# Patient Record
Sex: Female | Born: 1995
Health system: Southern US, Community
[De-identification: ages and names within clinical notes are randomized; demographics above are authoritative.]

## PROBLEM LIST (undated history)

## (undated) ENCOUNTER — Inpatient Hospital Stay (HOSPITAL_COMMUNITY): Payer: Self-pay

## (undated) DIAGNOSIS — R011 Cardiac murmur, unspecified: Secondary | ICD-10-CM

## (undated) DIAGNOSIS — R519 Headache, unspecified: Secondary | ICD-10-CM

## (undated) DIAGNOSIS — F1210 Cannabis abuse, uncomplicated: Secondary | ICD-10-CM

## (undated) DIAGNOSIS — R51 Headache: Secondary | ICD-10-CM

---
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
## 2013-11-06 ENCOUNTER — Encounter (HOSPITAL_BASED_OUTPATIENT_CLINIC_OR_DEPARTMENT_OTHER): Payer: Self-pay | Admitting: Emergency Medicine

## 2013-11-06 ENCOUNTER — Emergency Department (HOSPITAL_BASED_OUTPATIENT_CLINIC_OR_DEPARTMENT_OTHER)
Admission: EM | Admit: 2013-11-06 | Discharge: 2013-11-07 | Disposition: A | Discharge status: Home / Self Care | Payer: Medicaid Other | Attending: Emergency Medicine | Admitting: Emergency Medicine

## 2013-11-06 DIAGNOSIS — Z3202 Encounter for pregnancy test, result negative: Secondary | ICD-10-CM | POA: Insufficient documentation

## 2013-11-06 DIAGNOSIS — Z202 Contact with and (suspected) exposure to infections with a predominantly sexual mode of transmission: Secondary | ICD-10-CM | POA: Insufficient documentation

## 2013-11-06 DIAGNOSIS — N72 Inflammatory disease of cervix uteri: Secondary | ICD-10-CM

## 2013-11-06 DIAGNOSIS — N390 Urinary tract infection, site not specified: Secondary | ICD-10-CM | POA: Insufficient documentation

## 2013-11-06 LAB — URINALYSIS, ROUTINE W REFLEX MICROSCOPIC
Bilirubin Urine: NEGATIVE
Glucose, UA: NEGATIVE mg/dL
Hgb urine dipstick: NEGATIVE
KETONES UR: 15 mg/dL — AB
NITRITE: NEGATIVE
PH: 7 (ref 5.0–8.0)
PROTEIN: NEGATIVE mg/dL
Specific Gravity, Urine: 1.023 (ref 1.005–1.030)
UROBILINOGEN UA: 1 mg/dL (ref 0.0–1.0)

## 2013-11-06 LAB — URINE MICROSCOPIC-ADD ON

## 2013-11-06 LAB — WET PREP, GENITAL
TRICH WET PREP: NONE SEEN
Yeast Wet Prep HPF POC: NONE SEEN

## 2013-11-06 LAB — PREGNANCY, URINE: Preg Test, Ur: NEGATIVE

## 2013-11-06 NOTE — ED Provider Notes (Signed)
CSN: [id number]     Arrival date & time 11/06/13  2146 History  This chart was scribed for Jacaden Forbush Smitty CordsK Laquanna Veazey-Rasch, MD by Ellin MayhewMichael Levi, ED Scribe. This patient was seen in room MH09/MH09 and the patient's care was started at 11:11 PM.   Chief Complaint  Patient presents with  . Vaginal Discharge  . Exposure to STD   Patient is a 18 y.o. female presenting with STD exposure. The history is provided by the patient.  Exposure to STD This is a chronic problem. The current episode started more than 1 week ago. The problem occurs constantly. The problem has not changed since onset.Pertinent negatives include no chest pain, no abdominal pain, no headaches and no shortness of breath. Nothing aggravates the symptoms. Nothing relieves the symptoms. She has tried nothing for the symptoms. The treatment provided no relief.    HPI Comments: Drema DallasZenobia A Stroot is a 18 y.o. female who presents to the Emergency Department with a chief complaint of vaginal discharge with onset 3-5 days. Patient describes the discharge as a white, translucent liquid. LNMP 3 years ago, and has received Depo-Provera. Patient denies any vaginal pain, dysuria or frequency. Patient reports having changed sexual partners 6 months ago, and is not aware of STD status of partner.  History reviewed. No pertinent past medical history. History reviewed. No pertinent past surgical history. History reviewed. No pertinent family history. History  Substance Use Topics  . Smoking status: Never Smoker   . Smokeless tobacco: Not on file  . Alcohol Use: No   OB History   Grav Para Term Preterm Abortions TAB SAB Ect Mult Living                 Review of Systems  Constitutional: Negative for fever and chills.  Respiratory: Negative for shortness of breath.   Cardiovascular: Negative for chest pain.  Gastrointestinal: Negative for nausea, vomiting and abdominal pain.  Genitourinary: Positive for vaginal discharge. Negative for dysuria,  urgency, frequency, vaginal bleeding and vaginal pain.  Neurological: Negative for weakness and headaches.  All other systems reviewed and are negative.   Allergies  Review of patient's allergies indicates no known allergies.  Home Medications  No current outpatient prescriptions on file.  Triage Vitals: BP 121/66  Pulse 74  Temp(Src) 98.7 F (37.1 C) (Oral)  Resp 18  Ht 5\' 1"  (1.549 m)  Wt 120 lb (54.432 kg)  BMI 22.69 kg/m2  SpO2 98%  Physical Exam  Nursing note and vitals reviewed. Constitutional: She is oriented to person, place, and time. She appears well-developed and well-nourished. No distress.  HENT:  Head: Normocephalic and atraumatic.  Eyes: Conjunctivae and EOM are normal. Pupils are equal, round, and reactive to light.  Neck: Normal range of motion. Neck supple. No tracheal deviation present.  Cardiovascular: Normal rate.   Pulmonary/Chest: Effort normal. No respiratory distress.  Abdominal: Soft. Bowel sounds are normal. There is no tenderness. There is no rebound.  Genitourinary: Vaginal discharge found.  Green discharge chaperone present   Musculoskeletal: Normal range of motion.  Neurological: She is alert and oriented to person, place, and time.  Skin: Skin is warm and dry.  Psychiatric: She has a normal mood and affect. Her behavior is normal.    ED Course  Procedures (including critical care time)  DIAGNOSTIC STUDIES: Oxygen Saturation is 98% on room air, nml by my interpretation.    COORDINATION OF CARE: 11:20 PM-Wet prep genital, CG/Chlamydia Probe Amp, Pelvic cart, UA, and pregnancy labs ordered. Will  F/U with patient following results.Treatment plan discussed with patient and patient agrees.  Labs Review Labs Reviewed  URINALYSIS, ROUTINE W REFLEX MICROSCOPIC - Abnormal; Notable for the following:    APPearance CLOUDY (*)    Ketones, ur 15 (*)    Leukocytes, UA LARGE (*)    All other components within normal limits  URINE MICROSCOPIC-ADD  ON - Abnormal; Notable for the following:    Squamous Epithelial / LPF MANY (*)    Bacteria, UA MANY (*)    All other components within normal limits  PREGNANCY, URINE   Imaging Review No results found.   EKG Interpretation None      MDM   Final diagnoses:  None    Will treat for STIs, [facility]  I personally performed the services described in this documentation, which was scribed in my presence. The recorded information has been reviewed and is accurate.    Jasmine Awe, MD 11/07/13 [phone]

## 2013-11-06 NOTE — ED Notes (Signed)
Pt requesting STD checks and pregnancy test.

## 2013-11-07 MED ORDER — NITROFURANTOIN MONOHYD MACRO 100 MG PO CAPS
100.0000 mg | ORAL_CAPSULE | Freq: Once | ORAL | Status: AC
  Administered 2013-11-07: 100 mg via ORAL
  Filled 2013-11-07: qty 1

## 2013-11-07 MED ORDER — LIDOCAINE HCL (PF) 1 % IJ SOLN
INTRAMUSCULAR | Status: AC
  Administered 2013-11-07: 1.2 mL
  Filled 2013-11-07: qty 5

## 2013-11-07 MED ORDER — NITROFURANTOIN MONOHYD MACRO 100 MG PO CAPS: 100.0000 mg | ORAL_CAPSULE | Freq: Two times a day (BID) | ORAL | Status: DC

## 2013-11-07 MED ORDER — AZITHROMYCIN 1 G PO PACK
1.0000 g | PACK | Freq: Once | ORAL | Status: AC
  Administered 2013-11-07: 1 g via ORAL
  Filled 2013-11-07: qty 1

## 2013-11-07 MED ORDER — CEFTRIAXONE SODIUM 250 MG IJ SOLR
250.0000 mg | Freq: Once | INTRAMUSCULAR | Status: AC
  Administered 2013-11-07: 250 mg via INTRAMUSCULAR
  Filled 2013-11-07: qty 250

## 2013-11-07 NOTE — ED Notes (Signed)
MD at bedside discussing test results and dispo plan of care. 

## 2013-11-08 LAB — GC/CHLAMYDIA PROBE AMP
CT Probe RNA: NEGATIVE
GC Probe RNA: NEGATIVE

## 2014-03-12 ENCOUNTER — Emergency Department (HOSPITAL_BASED_OUTPATIENT_CLINIC_OR_DEPARTMENT_OTHER)
Admission: EM | Admit: 2014-03-12 | Discharge: 2014-03-12 | Disposition: A | Discharge status: Home / Self Care | Payer: Medicaid Other | Attending: Emergency Medicine | Admitting: Emergency Medicine

## 2014-03-12 ENCOUNTER — Encounter (HOSPITAL_BASED_OUTPATIENT_CLINIC_OR_DEPARTMENT_OTHER): Payer: Self-pay | Admitting: Emergency Medicine

## 2014-03-12 DIAGNOSIS — N390 Urinary tract infection, site not specified: Secondary | ICD-10-CM

## 2014-03-12 DIAGNOSIS — B9689 Other specified bacterial agents as the cause of diseases classified elsewhere: Secondary | ICD-10-CM | POA: Insufficient documentation

## 2014-03-12 DIAGNOSIS — R110 Nausea: Secondary | ICD-10-CM | POA: Insufficient documentation

## 2014-03-12 DIAGNOSIS — A499 Bacterial infection, unspecified: Secondary | ICD-10-CM | POA: Insufficient documentation

## 2014-03-12 DIAGNOSIS — N760 Acute vaginitis: Secondary | ICD-10-CM | POA: Insufficient documentation

## 2014-03-12 DIAGNOSIS — Z3202 Encounter for pregnancy test, result negative: Secondary | ICD-10-CM | POA: Insufficient documentation

## 2014-03-12 HISTORY — DX: Headache: R51

## 2014-03-12 HISTORY — DX: Headache, unspecified: R51.9

## 2014-03-12 LAB — CBC WITH DIFFERENTIAL/PLATELET
BASOS ABS: 0 10*3/uL (ref 0.0–0.1)
Basophils Relative: 0 % (ref 0–1)
EOS ABS: 0.2 10*3/uL (ref 0.0–0.7)
EOS PCT: 3 % (ref 0–5)
HEMATOCRIT: 35.9 % — AB (ref 36.0–46.0)
Hemoglobin: 11.7 g/dL — ABNORMAL LOW (ref 12.0–15.0)
LYMPHS ABS: 2.5 10*3/uL (ref 0.7–4.0)
Lymphocytes Relative: 31 % (ref 12–46)
MCH: 32.4 pg (ref 26.0–34.0)
MCHC: 32.6 g/dL (ref 30.0–36.0)
MCV: 99.4 fL (ref 78.0–100.0)
MONO ABS: 0.8 10*3/uL (ref 0.1–1.0)
Monocytes Relative: 9 % (ref 3–12)
Neutro Abs: 4.7 10*3/uL (ref 1.7–7.7)
Neutrophils Relative %: 57 % (ref 43–77)
PLATELETS: 143 10*3/uL — AB (ref 150–400)
RBC: 3.61 MIL/uL — ABNORMAL LOW (ref 3.87–5.11)
RDW: 12.5 % (ref 11.5–15.5)
WBC: 8.1 10*3/uL (ref 4.0–10.5)

## 2014-03-12 LAB — WET PREP, GENITAL
Trich, Wet Prep: NONE SEEN
YEAST WET PREP: NONE SEEN

## 2014-03-12 LAB — URINALYSIS, ROUTINE W REFLEX MICROSCOPIC
BILIRUBIN URINE: NEGATIVE
GLUCOSE, UA: NEGATIVE mg/dL
HGB URINE DIPSTICK: NEGATIVE
KETONES UR: NEGATIVE mg/dL
Nitrite: NEGATIVE
PROTEIN: NEGATIVE mg/dL
Specific Gravity, Urine: 1.013 (ref 1.005–1.030)
UROBILINOGEN UA: 1 mg/dL (ref 0.0–1.0)
pH: 7 (ref 5.0–8.0)

## 2014-03-12 LAB — URINE MICROSCOPIC-ADD ON

## 2014-03-12 LAB — BASIC METABOLIC PANEL
Anion gap: 13 (ref 5–15)
BUN: 8 mg/dL (ref 6–23)
CALCIUM: 9.4 mg/dL (ref 8.4–10.5)
CO2: 25 meq/L (ref 19–32)
CREATININE: 0.8 mg/dL (ref 0.50–1.10)
Chloride: 104 mEq/L (ref 96–112)
GFR calc Af Amer: >90 mL/min (ref >90)
GLUCOSE: 72 mg/dL (ref 70–99)
Potassium: 3.9 mEq/L (ref 3.7–5.3)
SODIUM: 142 meq/L (ref 137–147)

## 2014-03-12 LAB — PREGNANCY, URINE: Preg Test, Ur: NEGATIVE

## 2014-03-12 MED ORDER — METRONIDAZOLE 500 MG PO TABS: 500.0000 mg | ORAL_TABLET | Freq: Two times a day (BID) | ORAL | Status: DC

## 2014-03-12 MED ORDER — ONDANSETRON HCL 4 MG/2ML IJ SOLN
4.0000 mg | Freq: Once | INTRAMUSCULAR | Status: AC
  Administered 2014-03-12: 4 mg via INTRAVENOUS
  Filled 2014-03-12: qty 2

## 2014-03-12 MED ORDER — CEPHALEXIN 500 MG PO CAPS: 500.0000 mg | ORAL_CAPSULE | Freq: Four times a day (QID) | ORAL | Status: DC

## 2014-03-12 MED ORDER — SODIUM CHLORIDE 0.9 % IV BOLUS (SEPSIS)
1000.0000 mL | Freq: Once | INTRAVENOUS | Status: AC
  Administered 2014-03-12: 1000 mL via INTRAVENOUS

## 2014-03-12 MED ORDER — MORPHINE SULFATE 4 MG/ML IJ SOLN
4.0000 mg | Freq: Once | INTRAMUSCULAR | Status: AC
  Administered 2014-03-12: 4 mg via INTRAVENOUS
  Filled 2014-03-12: qty 1

## 2014-03-12 NOTE — ED Notes (Signed)
Pelvic cart set up in room 

## 2014-03-12 NOTE — ED Provider Notes (Signed)
CSN: [id number]     Arrival date & time 03/12/14  1412 History   First MD Initiated Contact with Patient 03/12/14 1422     Chief Complaint  Patient presents with  . Abdominal Pain     (Consider location/radiation/quality/duration/timing/severity/associated sxs/prior Treatment) HPI Comments: Patient is a 18 year old female who presents with abdominal pain for the past 2 days. The pain is located in her lower abdomen and radiates to her back. The pain is described as cramping and severe. The pain started gradually and progressively worsened since the onset. No alleviating/aggravating factors. The patient has tried nothing for symptoms without relief. Associated symptoms include mild nausea and vaginal discharge. Patient denies fever, headache, vomiting, diarrhea, chest pain, SOB, dysuria, constipation, abnormal vaginal bleeding.     Past Medical History  Diagnosis Date  . Headache    No past surgical history on file. No family history on file. History  Substance Use Topics  . Smoking status: Never Smoker   . Smokeless tobacco: Not on file  . Alcohol Use: No   OB History   Grav Para Term Preterm Abortions TAB SAB Ect Mult Living                 Review of Systems  Constitutional: Negative for fever, chills and fatigue.  HENT: Negative for trouble swallowing.   Eyes: Negative for visual disturbance.  Respiratory: Negative for shortness of breath.   Cardiovascular: Negative for chest pain and palpitations.  Gastrointestinal: Positive for nausea and abdominal pain. Negative for vomiting and diarrhea.  Genitourinary: Negative for dysuria and difficulty urinating.  Musculoskeletal: Positive for back pain. Negative for arthralgias and neck pain.  Skin: Negative for color change.  Neurological: Negative for dizziness and weakness.  Psychiatric/Behavioral: Negative for dysphoric mood.      Allergies  Review of patient's allergies indicates no known allergies.  Home Medications    Prior to Admission medications   Not on File   BP 116/68  Pulse 73  Temp(Src) 97.9 F (36.6 C) (Oral)  Resp 16  Ht 5\' 1"  (1.549 m)  Wt 116 lb (52.617 kg)  BMI 21.93 kg/m2  SpO2 100% Physical Exam  Nursing note and vitals reviewed. Constitutional: She is oriented to person, place, and time. She appears well-developed and well-nourished. No distress.  HENT:  Head: Normocephalic and atraumatic.  Eyes: Conjunctivae and EOM are normal.  Neck: Normal range of motion.  Cardiovascular: Normal rate and regular rhythm.  Exam reveals no gallop and no friction rub.   No murmur heard. Pulmonary/Chest: Effort normal and breath sounds normal. She has no wheezes. She has no rales. She exhibits no tenderness.  Abdominal: Soft. She exhibits no distension. There is tenderness. There is no rebound and no guarding.  Suprapubic tenderness to palpation. No other focal tenderness or peritoneal signs.   Genitourinary:  No CVA tenderness.   Musculoskeletal: Normal range of motion.  Neurological: She is alert and oriented to person, place, and time. Coordination normal.  Speech is goal-oriented. Moves limbs without ataxia.   Skin: Skin is warm and dry.  Psychiatric: She has a normal mood and affect. Her behavior is normal.    ED Course  Procedures (including critical care time) Labs Review Labs Reviewed  WET PREP, GENITAL - Abnormal; Notable for the following:    Clue Cells Wet Prep HPF POC MANY (*)    WBC, Wet Prep HPF POC MANY (*)    All other components within normal limits  URINALYSIS, ROUTINE W  REFLEX MICROSCOPIC - Abnormal; Notable for the following:    APPearance CLOUDY (*)    Leukocytes, UA LARGE (*)    All other components within normal limits  CBC WITH DIFFERENTIAL - Abnormal; Notable for the following:    RBC 3.61 (*)    Hemoglobin 11.7 (*)    HCT 35.9 (*)    Platelets 143 (*)    All other components within normal limits  URINE MICROSCOPIC-ADD ON - Abnormal; Notable for the  following:    Squamous Epithelial / LPF FEW (*)    Bacteria, UA MANY (*)    All other components within normal limits  GC/CHLAMYDIA PROBE AMP  PREGNANCY, URINE  BASIC METABOLIC PANEL    Imaging Review No results found.   EKG Interpretation None      MDM   Final diagnoses:  UTI (lower urinary tract infection)  BV (bacterial vaginosis)    3:33 PM Labs pending. Patient has a UTI. Patient will have pelvic exam. Vitals stable and patient afebrile. Patient given morphine and zofran for symptoms.   4:32 PM Patient has BV based on clue cells on wet prep. Patient will have Flagyl for BV and keflex for UTI. Vitals stable and patient afebrile. Patient instructed to follow up with PCP.   Emilia BeckKaitlyn Waddell Iten, [place] JerseyPA-C 03/12/14 [id number]

## 2014-03-12 NOTE — ED Provider Notes (Signed)
Medical screening examination/treatment/procedure(s) were performed by non-physician practitioner and as supervising physician I was immediately available for consultation/collaboration.   EKG Interpretation None        William Dominigue Gellner, MD 03/12/14 1756 

## 2014-03-12 NOTE — ED Notes (Signed)
Pt having lower abdominal pain since two days ago radiating to back.  Pt also c/o headache starting today. Some nausea.  No V/D.  No known fever.

## 2014-03-12 NOTE — Discharge Instructions (Signed)
Take Flagyl as directed until gone for your BV. Take keflex as directed until gone for your UTI. Refer to attached documents for more information.

## 2014-03-13 LAB — GC/CHLAMYDIA PROBE AMP
CT Probe RNA: NEGATIVE
GC Probe RNA: NEGATIVE

## 2015-08-03 ENCOUNTER — Encounter (HOSPITAL_BASED_OUTPATIENT_CLINIC_OR_DEPARTMENT_OTHER): Payer: Self-pay | Admitting: Emergency Medicine

## 2015-08-03 ENCOUNTER — Emergency Department (HOSPITAL_BASED_OUTPATIENT_CLINIC_OR_DEPARTMENT_OTHER)
Admission: EM | Admit: 2015-08-03 | Discharge: 2015-08-03 | Disposition: A | Discharge status: Home / Self Care | Payer: Medicaid Other | Attending: Emergency Medicine | Admitting: Emergency Medicine

## 2015-08-03 DIAGNOSIS — Z792 Long term (current) use of antibiotics: Secondary | ICD-10-CM | POA: Insufficient documentation

## 2015-08-03 DIAGNOSIS — R1110 Vomiting, unspecified: Secondary | ICD-10-CM | POA: Insufficient documentation

## 2015-08-03 DIAGNOSIS — N390 Urinary tract infection, site not specified: Secondary | ICD-10-CM | POA: Insufficient documentation

## 2015-08-03 DIAGNOSIS — R197 Diarrhea, unspecified: Secondary | ICD-10-CM | POA: Insufficient documentation

## 2015-08-03 DIAGNOSIS — Z3202 Encounter for pregnancy test, result negative: Secondary | ICD-10-CM | POA: Insufficient documentation

## 2015-08-03 LAB — URINALYSIS, ROUTINE W REFLEX MICROSCOPIC
BILIRUBIN URINE: NEGATIVE
GLUCOSE, UA: NEGATIVE mg/dL
HGB URINE DIPSTICK: NEGATIVE
KETONES UR: 40 mg/dL — AB
NITRITE: NEGATIVE
PH: 8.5 — AB (ref 5.0–8.0)
Protein, ur: 100 mg/dL — AB
SPECIFIC GRAVITY, URINE: 1.025 (ref 1.005–1.030)

## 2015-08-03 LAB — COMPREHENSIVE METABOLIC PANEL
ALBUMIN: 4.6 g/dL (ref 3.5–5.0)
ALK PHOS: 73 U/L (ref 38–126)
ALT: 20 U/L (ref 14–54)
AST: 28 U/L (ref 15–41)
Anion gap: 10 (ref 5–15)
BILIRUBIN TOTAL: 0.5 mg/dL (ref 0.3–1.2)
BUN: 11 mg/dL (ref 6–20)
CALCIUM: 9.5 mg/dL (ref 8.9–10.3)
CO2: 21 mmol/L — AB (ref 22–32)
Chloride: 108 mmol/L (ref 101–111)
Creatinine, Ser: 0.64 mg/dL (ref 0.44–1.00)
GFR calc Af Amer: >60 mL/min (ref >60)
GFR calc non Af Amer: >60 mL/min (ref >60)
GLUCOSE: 130 mg/dL — AB (ref 65–99)
POTASSIUM: 3.9 mmol/L (ref 3.5–5.1)
SODIUM: 139 mmol/L (ref 135–145)
TOTAL PROTEIN: 7.6 g/dL (ref 6.5–8.1)

## 2015-08-03 LAB — URINE MICROSCOPIC-ADD ON

## 2015-08-03 LAB — CBC
HEMATOCRIT: 38.4 % (ref 36.0–46.0)
Hemoglobin: 12.4 g/dL (ref 12.0–15.0)
MCH: 31.5 pg (ref 26.0–34.0)
MCHC: 32.3 g/dL (ref 30.0–36.0)
MCV: 97.5 fL (ref 78.0–100.0)
Platelets: 201 10*3/uL (ref 150–400)
RBC: 3.94 MIL/uL (ref 3.87–5.11)
RDW: 12.3 % (ref 11.5–15.5)
WBC: 9.1 10*3/uL (ref 4.0–10.5)

## 2015-08-03 LAB — PREGNANCY, URINE: Preg Test, Ur: NEGATIVE

## 2015-08-03 LAB — LIPASE, BLOOD: Lipase: 19 U/L (ref 11–51)

## 2015-08-03 MED ORDER — ONDANSETRON 4 MG PO TBDP: 4.0000 mg | ORAL_TABLET | Freq: Three times a day (TID) | ORAL | Status: DC | PRN

## 2015-08-03 MED ORDER — ONDANSETRON HCL 4 MG/2ML IJ SOLN
4.0000 mg | Freq: Once | INTRAMUSCULAR | Status: AC
  Administered 2015-08-03: 4 mg via INTRAVENOUS
  Filled 2015-08-03: qty 2

## 2015-08-03 MED ORDER — SODIUM CHLORIDE 0.9 % IV BOLUS (SEPSIS)
1000.0000 mL | Freq: Once | INTRAVENOUS | Status: AC
  Administered 2015-08-03: 1000 mL via INTRAVENOUS

## 2015-08-03 MED ORDER — CEPHALEXIN 500 MG PO CAPS: 500.0000 mg | ORAL_CAPSULE | Freq: Two times a day (BID) | ORAL | Status: DC

## 2015-08-03 NOTE — ED Notes (Signed)
Patient reports that she is N/V  - denies any pain

## 2015-08-03 NOTE — Discharge Instructions (Signed)
Nausea and Vomiting °Nausea is a sick feeling that often comes before throwing up (vomiting). Vomiting is a reflex where stomach contents come out of your mouth. Vomiting can cause severe loss of body fluids (dehydration). Children and elderly adults can become dehydrated quickly, especially if they also have diarrhea. Nausea and vomiting are symptoms of a condition or disease. It is important to find the cause of your symptoms. °CAUSES  °· Direct irritation of the stomach lining. This irritation can result from increased acid production (gastroesophageal reflux disease), infection, food poisoning, taking certain medicines (such as nonsteroidal anti-inflammatory drugs), alcohol use, or tobacco use. °· Signals from the brain. These signals could be caused by a headache, heat exposure, an inner ear disturbance, increased pressure in the brain from injury, infection, a tumor, or a concussion, pain, emotional stimulus, or metabolic problems. °· An obstruction in the gastrointestinal tract (bowel obstruction). °· Illnesses such as diabetes, hepatitis, gallbladder problems, appendicitis, kidney problems, cancer, sepsis, atypical symptoms of a heart attack, or eating disorders. °· Medical treatments such as chemotherapy and radiation. °· Receiving medicine that makes you sleep (general anesthetic) during surgery. °DIAGNOSIS °Your caregiver may ask for tests to be done if the problems do not improve after a few days. Tests may also be done if symptoms are severe or if the reason for the nausea and vomiting is not clear. Tests may include: °· Urine tests. °· Blood tests. °· Stool tests. °· Cultures (to look for evidence of infection). °· X-rays or other imaging studies. °Test results can help your caregiver make decisions about treatment or the need for additional tests. °TREATMENT °You need to stay well hydrated. Drink frequently but in small amounts. You may wish to drink water, sports drinks, clear broth, or eat frozen  ice pops or gelatin dessert to help stay hydrated. When you eat, eating slowly may help prevent nausea. There are also some antinausea medicines that may help prevent nausea. °HOME CARE INSTRUCTIONS  °· Take all medicine as directed by your caregiver. °· If you do not have an appetite, do not force yourself to eat. However, you must continue to drink fluids. °· If you have an appetite, eat a normal diet unless your caregiver tells you differently. °· Eat a variety of complex carbohydrates (rice, wheat, potatoes, bread), lean meats, yogurt, fruits, and vegetables. °· Avoid high-fat foods because they are more difficult to digest. °· Drink enough water and fluids to keep your urine clear or pale yellow. °· If you are dehydrated, ask your caregiver for specific rehydration instructions. Signs of dehydration may include: °· Severe thirst. °· Dry lips and mouth. °· Dizziness. °· Dark urine. °· Decreasing urine frequency and amount. °· Confusion. °· Rapid breathing or pulse. °SEEK IMMEDIATE MEDICAL CARE IF:  °· You have blood or brown flecks (like coffee grounds) in your vomit. °· You have black or bloody stools. °· You have a severe headache or stiff neck. °· You are confused. °· You have severe abdominal pain. °· You have chest pain or trouble breathing. °· You do not urinate at least once every 8 hours. °· You develop cold or clammy skin. °· You continue to vomit for longer than 24 to 48 hours. °· You have a fever. °MAKE SURE YOU:  °· Understand these instructions. °· Will watch your condition. °· Will get help right away if you are not doing well or get worse. °  °This information is not intended to replace advice given to you by your health care provider. Make sure   you discuss any questions you have with your health care provider. °  °Document Released: 08/08/2005 Document Revised: 10/31/2011 Document Reviewed: 01/05/2011 °Elsevier Interactive Patient Education ©2016 [employer] ° °Urinary Tract Infection °Urinary  tract infections (UTIs) can develop anywhere along your urinary tract. Your urinary tract is your body's drainage system for removing wastes and extra water. Your urinary tract includes two kidneys, two ureters, a bladder, and a urethra. Your kidneys are a pair of bean-shaped organs. Each kidney is about the size of your fist. They are located below your ribs, one on each side of your spine. °CAUSES °Infections are caused by microbes, which are microscopic organisms, including fungi, viruses, and bacteria. These organisms are so small that they can only be seen through a microscope. Bacteria are the microbes that most commonly cause UTIs. °SYMPTOMS  °Symptoms of UTIs may vary by age and gender of the patient and by the location of the infection. Symptoms in young women typically include a frequent and intense urge to urinate and a painful, burning feeling in the bladder or urethra during urination. Older women and men are more likely to be tired, shaky, and weak and have muscle aches and abdominal pain. A fever may mean the infection is in your kidneys. Other symptoms of a kidney infection include pain in your back or sides below the ribs, nausea, and vomiting. °DIAGNOSIS °To diagnose a UTI, your caregiver will ask you about your symptoms. Your caregiver will also ask you to provide a urine sample. The urine sample will be tested for bacteria and white blood cells. White blood cells are made by your body to help fight infection. °TREATMENT  °Typically, UTIs can be treated with medication. Because most UTIs are caused by a bacterial infection, they usually can be treated with the use of antibiotics. The choice of antibiotic and length of treatment depend on your symptoms and the type of bacteria causing your infection. °HOME CARE INSTRUCTIONS °· If you were prescribed antibiotics, take them exactly as your caregiver instructs you. Finish the medication even if you feel better after you have only taken some of the  medication. °· Drink enough water and fluids to keep your urine clear or pale yellow. °· Avoid caffeine, tea, and carbonated beverages. They tend to irritate your bladder. °· Empty your bladder often. Avoid holding urine for long periods of time. °· Empty your bladder before and after sexual intercourse. °· After a bowel movement, women should cleanse from front to back. Use each tissue only once. °SEEK MEDICAL CARE IF:  °· You have back pain. °· You develop a fever. °· Your symptoms do not begin to resolve within 3 days. °SEEK IMMEDIATE MEDICAL CARE IF:  °· You have severe back pain or lower abdominal pain. °· You develop chills. °· You have nausea or vomiting. °· You have continued burning or discomfort with urination. °MAKE SURE YOU:  °· Understand these instructions. °· Will watch your condition. °· Will get help right away if you are not doing well or get worse. °  °This information is not intended to replace advice given to you by your health care provider. Make sure you discuss any questions you have with your health care provider. °  °Document Released: 05/18/2005 Document Revised: 04/29/2015 Document Reviewed: 09/16/2011 °Elsevier Interactive Patient Education ©2016 [employer] ° ° °

## 2015-08-03 NOTE — ED Provider Notes (Signed)
CSN: [id number]     Arrival date & time 08/03/15  1403 History   First MD Initiated Contact with Patient 08/03/15 1414     Chief Complaint  Patient presents with  . Emesis     (Consider location/radiation/quality/duration/timing/severity/associated sxs/prior Treatment) HPI Comments: Pt comes in with c/o n/v/d that started this morning. Pt unable to keep anything down. Generalized abdominal cramping and chills. No fever, dysuria. Hasn't taken anything for the symptoms. States she also has a mild headache since she started vomiting.  The history is provided by the patient. No language interpreter was used.    Past Medical History  Diagnosis Date  . Headache    History reviewed. No pertinent past surgical history. History reviewed. No pertinent family history. Social History  Substance Use Topics  . Smoking status: Never Smoker   . Smokeless tobacco: None  . Alcohol Use: No   OB History    No data available     Review of Systems  All other systems reviewed and are negative.     Allergies  Review of patient's allergies indicates no known allergies.  Home Medications   Prior to Admission medications   Medication Sig Start Date End Date Taking? Authorizing Provider  cephALEXin (KEFLEX) 500 MG capsule Take 1 capsule (500 mg total) by mouth 4 (four) times daily. 03/12/14   Kaitlyn Szekalski, PA-C  metroNIDAZOLE (FLAGYL) 500 MG tablet Take 1 tablet (500 mg total) by mouth 2 (two) times daily. 03/12/14   Kaitlyn Szekalski, PA-C   BP 116/66 mmHg  Pulse 60  Temp(Src) 98.3 F (36.8 C) (Oral)  Resp 16  Ht 5\' 1"  (1.549 m)  Wt 54.432 kg  BMI 22.69 kg/m2  SpO2 100%  LMP 08/02/2015 Physical Exam  Constitutional: She is oriented to person, place, and time. She appears well-developed and well-nourished.  HENT:  Head: Normocephalic and atraumatic.  Cardiovascular: Normal rate and regular rhythm.   Pulmonary/Chest: Effort normal and breath sounds normal.  Abdominal: Soft.  Bowel sounds are normal. There is no tenderness. There is no CVA tenderness.  Musculoskeletal: Normal range of motion.  Neurological: She is alert and oriented to person, place, and time. Coordination normal.  Skin: Skin is warm and dry.  Nursing note and vitals reviewed.   ED Course  Procedures (including critical care time) Labs Review Labs Reviewed  COMPREHENSIVE METABOLIC PANEL - Abnormal; Notable for the following:    CO2 21 (*)    Glucose, Bld 130 (*)    All other components within normal limits  URINALYSIS, ROUTINE W REFLEX MICROSCOPIC (NOT AT [facility]) - Abnormal; Notable for the following:    APPearance CLOUDY (*)    pH 8.5 (*)    Ketones, ur 40 (*)    Protein, ur 100 (*)    Leukocytes, UA MODERATE (*)    All other components within normal limits  URINE MICROSCOPIC-ADD ON - Abnormal; Notable for the following:    Squamous Epithelial / LPF 6-30 (*)    Bacteria, UA MANY (*)    All other components within normal limits  URINE CULTURE  LIPASE, BLOOD  CBC  PREGNANCY, URINE    Imaging Review No results found. I have personally reviewed and evaluated these images and lab results as part of my medical decision-making.   EKG Interpretation None      MDM   Final diagnoses:  Vomiting and diarrhea  UTI (lower urinary tract infection)    Pt is tolerating po without any problem. Possible uti. Urine sent  for culture. Will send home with zofran and keflex. Discussed return precautions with pt    Teressa Lower, NP 08/03/15 1601  Azalia Bilis, MD 08/04/15 [phone]

## 2015-08-05 LAB — URINE CULTURE

## 2015-12-22 ENCOUNTER — Emergency Department (HOSPITAL_BASED_OUTPATIENT_CLINIC_OR_DEPARTMENT_OTHER)
Admission: EM | Admit: 2015-12-22 | Discharge: 2015-12-22 | Disposition: A | Discharge status: Home / Self Care | Payer: Medicaid Other | Attending: Emergency Medicine | Admitting: Emergency Medicine

## 2015-12-22 ENCOUNTER — Encounter (HOSPITAL_BASED_OUTPATIENT_CLINIC_OR_DEPARTMENT_OTHER): Payer: Self-pay | Admitting: *Deleted

## 2015-12-22 DIAGNOSIS — A599 Trichomoniasis, unspecified: Secondary | ICD-10-CM | POA: Insufficient documentation

## 2015-12-22 DIAGNOSIS — R51 Headache: Secondary | ICD-10-CM | POA: Insufficient documentation

## 2015-12-22 LAB — URINE MICROSCOPIC-ADD ON

## 2015-12-22 LAB — URINALYSIS, ROUTINE W REFLEX MICROSCOPIC
BILIRUBIN URINE: NEGATIVE
GLUCOSE, UA: NEGATIVE mg/dL
Hgb urine dipstick: NEGATIVE
KETONES UR: NEGATIVE mg/dL
Nitrite: NEGATIVE
Protein, ur: NEGATIVE mg/dL
SPECIFIC GRAVITY, URINE: 1.023 (ref 1.005–1.030)
pH: 6.5 (ref 5.0–8.0)

## 2015-12-22 LAB — WET PREP, GENITAL: Sperm: NONE SEEN

## 2015-12-22 LAB — PREGNANCY, URINE: PREG TEST UR: NEGATIVE

## 2015-12-22 MED ORDER — AZITHROMYCIN 250 MG PO TABS: 250.0000 mg | ORAL_TABLET | Freq: Every day | ORAL | Status: DC

## 2015-12-22 MED ORDER — ACETAMINOPHEN 325 MG PO TABS
650.0000 mg | ORAL_TABLET | Freq: Once | ORAL | Status: AC
  Administered 2015-12-22: 650 mg via ORAL
  Filled 2015-12-22: qty 2

## 2015-12-22 MED ORDER — FLUCONAZOLE 150 MG PO TABS: 150.0000 mg | ORAL_TABLET | Freq: Once | ORAL | Status: DC

## 2015-12-22 MED ORDER — METRONIDAZOLE 500 MG PO TABS
2000.0000 mg | ORAL_TABLET | Freq: Once | ORAL | Status: AC
  Administered 2015-12-22: 2000 mg via ORAL
  Filled 2015-12-22: qty 4

## 2015-12-22 MED ORDER — CEFTRIAXONE SODIUM 250 MG IJ SOLR
250.0000 mg | Freq: Once | INTRAMUSCULAR | Status: AC
Start: 2015-12-22 — End: 2015-12-22
  Administered 2015-12-22: 250 mg via INTRAMUSCULAR
  Filled 2015-12-22: qty 250

## 2015-12-22 MED FILL — FLUCONAZOLE 150 MG TABLET: 150 | 1 days supply | Qty: 1 | Fill #0

## 2015-12-22 MED FILL — AZITHROMYCIN 250 MG TABLET: 250 | 1 days supply | Qty: 4 | Fill #0

## 2015-12-22 NOTE — Discharge Instructions (Signed)
Sexually Transmitted Disease Use a condom each time that you have sex. Take the medication prescribed later today. Our tests show that you have trichomoniasis, which is an STD and a yeast infection. Follow-up with the health department if not better in a few days. Tell all sexual partners so that they can get seen at the health department or by their primary care physicians A sexually transmitted disease (STD) is a disease or infection often passed to another person during sex. However, STDs can be passed through nonsexual ways. An STD can be passed through:  Spit (saliva).  Semen.  Blood.  Mucus from the vagina.  Pee (urine). HOW CAN I LESSEN MY CHANCES OF GETTING AN STD?  Use:  Latex condoms.  Water-soluble lubricants with condoms. Do not use petroleum jelly or oils.  Dental dams. These are small pieces of latex that are used as a barrier during oral sex.  Avoid having more than one sex partner.  Do not have sex with someone who has other sex partners.  Do not have sex with anyone you do not know or who is at high risk for an STD.  Avoid risky sex that can break your skin.  Do not have sex if you have open sores on your mouth or skin.  Avoid drinking too much alcohol or taking illegal drugs. Alcohol and drugs can affect your good judgment.  Avoid oral and anal sex acts.  Get shots (vaccines) for HPV and hepatitis.  If you are at risk of being infected with HIV, it is advised that you take a certain medicine daily to prevent HIV infection. This is called pre-exposure prophylaxis (PrEP). You may be at risk if:  You are a man who has sex with other men (MSM).  You are attracted to the opposite sex (heterosexual) and are having sex with more than one partner.  You take drugs with a needle.  You have sex with someone who has HIV.  Talk with your doctor about if you are at high risk of being infected with HIV. If you begin to take PrEP, get tested for HIV first. Get  tested every 3 months for as long as you are taking PrEP.  Get tested for STDs every year if you are sexually active. If you are treated for an STD, get tested again 3 months after you are treated. WHAT SHOULD I DO IF I THINK I HAVE AN STD?  See your doctor.  Tell your sex partner(s) that you have an STD. They should be tested and treated.  Do not have sex until your doctor says it is okay. WHEN SHOULD I GET HELP? Get help right away if:  You have bad belly (abdominal) pain.  You are a man and have puffiness (swelling) or pain in your testicles.  You are a woman and have puffiness in your vagina.   This information is not intended to replace advice given to you by your health care provider. Make sure you discuss any questions you have with your health care provider.   Document Released: 09/15/2004 Document Revised: 08/29/2014 Document Reviewed: 02/01/2013 Elsevier Interactive Patient Education [employer]2016 [employer] Inc.

## 2015-12-22 NOTE — ED Notes (Addendum)
Pt c/o vag discharge x 2-3 days. Pt states symptoms seem similar to when she had BV. Pt denies urinary symptoms. Pt also c/o HA x 1week. Has not taken any meds. Pt eating chicken wings on assessment.

## 2015-12-22 NOTE — ED Provider Notes (Signed)
CSN: [id number]     Arrival date & time 12/22/15  1336 History   First MD Initiated Contact with Patient 12/22/15 1459     Chief Complaint  Patient presents with  . Vaginal Discharge     (Consider location/radiation/quality/duration/timing/severity/associated sxs/prior Treatment) HPI Complains of lower abdominal pain for 2 days gradual onset minimal at present. She denies any urinary symptoms denies anorexia denies fever. Denies other associated symptoms Symptoms come to by vaginal discharge similar symptoms she's had in the past with bacterial vaginosis. She also reports that she's had an STD in the past. No treatment prior to coming here. Last normal menstrual period ended 2 days ago. Nothing makes symptoms better or worse. She also complains of a headache for the past 1 week gradual onset which feels like "eyestrain" patient reports that she lost her eyeglasses. Headache is moderate behind both eyes and nonradiating. No treatment prior to coming here. Worse when she strains her eyes not improved by anything Past Medical History  Diagnosis Date  . Headache   STD History reviewed. No pertinent past surgical history. History reviewed. No pertinent family history. Social History  Substance Use Topics  . Smoking status: Never Smoker   . Smokeless tobacco: None  . Alcohol Use: No  Positive smoker positive alcohol denies illicit drug use OB History    No data available     Review of Systems  Constitutional: Negative.   Respiratory: Negative.   Cardiovascular: Negative.   Gastrointestinal: Positive for abdominal pain.  Genitourinary: Positive for vaginal discharge.  Musculoskeletal: Negative.   Skin: Negative.   Neurological: Positive for headaches.  Psychiatric/Behavioral: Negative.   All other systems reviewed and are negative.     Allergies  Review of patient's allergies indicates no known allergies.  Home Medications   Prior to Admission medications   Not on File   BP  120/68 mmHg  Pulse 72  Temp(Src) 98.8 F (37.1 C) (Oral)  Resp 18  Ht 5\' 1"  (1.549 m)  Wt 106 lb (48.081 kg)  BMI 20.04 kg/m2  SpO2 100%  LMP 12/16/2015 Physical Exam  Constitutional: She is oriented to person, place, and time. She appears well-developed and well-nourished.  HENT:  Head: Normocephalic and atraumatic.  Eyes: Conjunctivae are normal. Pupils are equal, round, and reactive to light.  Neck: Neck supple. No tracheal deviation present. No thyromegaly present.  Cardiovascular: Normal rate and regular rhythm.   No murmur heard. Pulmonary/Chest: Effort normal and breath sounds normal.  Abdominal: Soft. Bowel sounds are normal. She exhibits no distension. There is no tenderness.  Genitourinary:  Pelvic exam no external lesion. Positive white vaginal discharge. Cervical os closed. No cervical motion tenderness no adnexal masses or tenderness  Musculoskeletal: Normal range of motion. She exhibits no edema or tenderness.  Neurological: She is alert and oriented to person, place, and time. No cranial nerve deficit. Coordination normal.  Gait normal Romberg normal pronator drift normal  Skin: Skin is warm and dry. No rash noted.  Psychiatric: She has a normal mood and affect.  Nursing note and vitals reviewed.   ED Course  Procedures (including critical care time) Labs Review Labs Reviewed  URINALYSIS, ROUTINE W REFLEX MICROSCOPIC (NOT AT [facility]) - Abnormal; Notable for the following:    Color, Urine AMBER (*)    APPearance CLOUDY (*)    Leukocytes, UA LARGE (*)    All other components within normal limits  URINE MICROSCOPIC-ADD ON - Abnormal; Notable for the following:    Squamous Epithelial /  LPF 6-30 (*)    Bacteria, UA MANY (*)    All other components within normal limits  PREGNANCY, URINE    Imaging Review No results found. I have personally reviewed and evaluated these images and lab results as part of my medical decision-making.   EKG Interpretation None      Results for orders placed or performed during the hospital encounter of 12/22/15  Wet prep, genital  Result Value Ref Range   Yeast Wet Prep HPF POC PRESENT (A) NONE SEEN   Trich, Wet Prep PRESENT (A) NONE SEEN   Clue Cells Wet Prep HPF POC PRESENT (A) NONE SEEN   WBC, Wet Prep HPF POC MANY (A) NONE SEEN   Sperm NONE SEEN   Pregnancy, urine  Result Value Ref Range   Preg Test, Ur NEGATIVE NEGATIVE  Urinalysis, Routine w reflex microscopic (not at [facility])  Result Value Ref Range   Color, Urine AMBER (A) YELLOW   APPearance CLOUDY (A) CLEAR   Specific Gravity, Urine 1.023 1.005 - 1.030   pH 6.5 5.0 - 8.0   Glucose, UA NEGATIVE NEGATIVE mg/dL   Hgb urine dipstick NEGATIVE NEGATIVE   Bilirubin Urine NEGATIVE NEGATIVE   Ketones, ur NEGATIVE NEGATIVE mg/dL   Protein, ur NEGATIVE NEGATIVE mg/dL   Nitrite NEGATIVE NEGATIVE   Leukocytes, UA LARGE (A) NEGATIVE  Urine microscopic-add on  Result Value Ref Range   Squamous Epithelial / LPF 6-30 (A) NONE SEEN   WBC, UA 6-30 0 - 5 WBC/hpf   RBC / HPF 0-5 0 - 5 RBC/hpf   Bacteria, UA MANY (A) NONE SEEN   Trichomonas, UA PRESENT    Urine-Other YEAST PRESENT    No results found.  MDM  Wet prep consistent with trichomoniasis. We'll treat empirically for other STDs. Rocephin, Flagyl. Prescription Zithromax,diflucan Referral to health department. Safe sex encouraged Diagnosis #1 trichomoniasis #2 headache Final diagnoses:  None  #3 yeast vaginitis      Doug Sou, MD 12/22/15 1537

## 2015-12-22 NOTE — ED Notes (Signed)
Pt c/o vaginal discharge x 2 days with h/a x 1 week

## 2015-12-23 LAB — HIV ANTIBODY (ROUTINE TESTING W REFLEX): HIV Screen 4th Generation wRfx: NONREACTIVE

## 2015-12-23 LAB — GC/CHLAMYDIA PROBE AMP (~~LOC~~) NOT AT ARMC
Chlamydia: NEGATIVE
Neisseria Gonorrhea: POSITIVE — AB

## 2015-12-23 LAB — RPR: RPR Ser Ql: NONREACTIVE

## 2015-12-24 ENCOUNTER — Telehealth: Payer: Self-pay | Admitting: *Deleted

## 2015-12-24 NOTE — ED Notes (Signed)
Spoke with patient, verified ID, informed of labs, treated per protocol, DHHS form faxed, patient informed to abstain for sexual activity x 10 days and notify sexual partners for evaluation and treatment 

## 2016-03-18 ENCOUNTER — Emergency Department (HOSPITAL_BASED_OUTPATIENT_CLINIC_OR_DEPARTMENT_OTHER)
Admission: EM | Admit: 2016-03-18 | Discharge: 2016-03-18 | Discharge status: Against Medical Advice | Payer: Medicaid Other | Attending: Emergency Medicine | Admitting: Emergency Medicine

## 2016-03-18 ENCOUNTER — Encounter (HOSPITAL_BASED_OUTPATIENT_CLINIC_OR_DEPARTMENT_OTHER): Payer: Self-pay | Admitting: *Deleted

## 2016-03-18 DIAGNOSIS — R1030 Lower abdominal pain, unspecified: Secondary | ICD-10-CM | POA: Insufficient documentation

## 2016-03-18 DIAGNOSIS — Z5321 Procedure and treatment not carried out due to patient leaving prior to being seen by health care provider: Secondary | ICD-10-CM | POA: Insufficient documentation

## 2016-03-18 LAB — URINE MICROSCOPIC-ADD ON: RBC / HPF: NONE SEEN RBC/hpf (ref 0–5)

## 2016-03-18 LAB — URINALYSIS, ROUTINE W REFLEX MICROSCOPIC
BILIRUBIN URINE: NEGATIVE
Glucose, UA: NEGATIVE mg/dL
HGB URINE DIPSTICK: NEGATIVE
Ketones, ur: NEGATIVE mg/dL
Nitrite: NEGATIVE
PROTEIN: NEGATIVE mg/dL
Specific Gravity, Urine: 1.017 (ref 1.005–1.030)
pH: 6.5 (ref 5.0–8.0)

## 2016-03-18 LAB — PREGNANCY, URINE: PREG TEST UR: NEGATIVE

## 2016-03-18 NOTE — ED Triage Notes (Signed)
Vaginal discharge and odor. Lower abdominal pain.

## 2016-04-10 ENCOUNTER — Encounter (HOSPITAL_BASED_OUTPATIENT_CLINIC_OR_DEPARTMENT_OTHER): Payer: Self-pay | Admitting: *Deleted

## 2016-04-10 ENCOUNTER — Emergency Department (HOSPITAL_BASED_OUTPATIENT_CLINIC_OR_DEPARTMENT_OTHER)
Admission: EM | Admit: 2016-04-10 | Discharge: 2016-04-10 | Disposition: A | Discharge status: Home / Self Care | Payer: No Typology Code available for payment source | Attending: Dermatology | Admitting: Dermatology

## 2016-04-10 DIAGNOSIS — Y92410 Unspecified street and highway as the place of occurrence of the external cause: Secondary | ICD-10-CM | POA: Insufficient documentation

## 2016-04-10 DIAGNOSIS — Z5321 Procedure and treatment not carried out due to patient leaving prior to being seen by health care provider: Secondary | ICD-10-CM | POA: Insufficient documentation

## 2016-04-10 DIAGNOSIS — R51 Headache: Secondary | ICD-10-CM | POA: Diagnosis not present

## 2016-04-10 DIAGNOSIS — Y999 Unspecified external cause status: Secondary | ICD-10-CM | POA: Diagnosis not present

## 2016-04-10 DIAGNOSIS — Y9389 Activity, other specified: Secondary | ICD-10-CM | POA: Diagnosis not present

## 2016-04-10 DIAGNOSIS — M542 Cervicalgia: Secondary | ICD-10-CM | POA: Diagnosis not present

## 2016-04-10 LAB — PREGNANCY, URINE: PREG TEST UR: NEGATIVE
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
## 2016-04-10 NOTE — ED Notes (Signed)
Called in lobby. No answer.

## 2016-04-10 NOTE — ED Notes (Signed)
Called in lobby. No answer. 

## 2016-12-07 ENCOUNTER — Emergency Department (HOSPITAL_BASED_OUTPATIENT_CLINIC_OR_DEPARTMENT_OTHER)
Admission: EM | Admit: 2016-12-07 | Discharge: 2016-12-07 | Disposition: A | Discharge status: Home / Self Care | Payer: Medicaid Other | Attending: Emergency Medicine | Admitting: Emergency Medicine

## 2016-12-07 ENCOUNTER — Encounter (HOSPITAL_BASED_OUTPATIENT_CLINIC_OR_DEPARTMENT_OTHER): Payer: Self-pay | Admitting: Emergency Medicine

## 2016-12-07 ENCOUNTER — Emergency Department (HOSPITAL_BASED_OUTPATIENT_CLINIC_OR_DEPARTMENT_OTHER): Payer: Medicaid Other

## 2016-12-07 DIAGNOSIS — B9689 Other specified bacterial agents as the cause of diseases classified elsewhere: Secondary | ICD-10-CM

## 2016-12-07 DIAGNOSIS — Z3490 Encounter for supervision of normal pregnancy, unspecified, unspecified trimester: Secondary | ICD-10-CM

## 2016-12-07 DIAGNOSIS — Z87891 Personal history of nicotine dependence: Secondary | ICD-10-CM | POA: Insufficient documentation

## 2016-12-07 DIAGNOSIS — O23591 Infection of other part of genital tract in pregnancy, first trimester: Secondary | ICD-10-CM | POA: Insufficient documentation

## 2016-12-07 DIAGNOSIS — Z3A01 Less than 8 weeks gestation of pregnancy: Secondary | ICD-10-CM | POA: Insufficient documentation

## 2016-12-07 DIAGNOSIS — N760 Acute vaginitis: Secondary | ICD-10-CM

## 2016-12-07 DIAGNOSIS — R102 Pelvic and perineal pain: Secondary | ICD-10-CM | POA: Insufficient documentation

## 2016-12-07 LAB — CBC WITH DIFFERENTIAL/PLATELET
BASOS ABS: 0 10*3/uL (ref 0.0–0.1)
Basophils Relative: 0 %
Eosinophils Absolute: 0.1 10*3/uL (ref 0.0–0.7)
Eosinophils Relative: 1 %
HEMATOCRIT: 38.2 % (ref 36.0–46.0)
HEMOGLOBIN: 13.1 g/dL (ref 12.0–15.0)
LYMPHS PCT: 20 %
Lymphs Abs: 2.3 10*3/uL (ref 0.7–4.0)
MCH: 33.2 pg (ref 26.0–34.0)
MCHC: 34.3 g/dL (ref 30.0–36.0)
MCV: 96.7 fL (ref 78.0–100.0)
Monocytes Absolute: 0.9 10*3/uL (ref 0.1–1.0)
Monocytes Relative: 8 %
NEUTROS ABS: 8.3 10*3/uL — AB (ref 1.7–7.7)
NEUTROS PCT: 71 %
Platelets: 198 10*3/uL (ref 150–400)
RBC: 3.95 MIL/uL (ref 3.87–5.11)
RDW: 12.1 % (ref 11.5–15.5)
WBC: 11.6 10*3/uL — AB (ref 4.0–10.5)

## 2016-12-07 LAB — URINALYSIS, ROUTINE W REFLEX MICROSCOPIC
BILIRUBIN URINE: NEGATIVE
GLUCOSE, UA: NEGATIVE mg/dL
HGB URINE DIPSTICK: NEGATIVE
Nitrite: NEGATIVE
PH: 7.5 (ref 5.0–8.0)
Protein, ur: NEGATIVE mg/dL
Specific Gravity, Urine: 1.021 (ref 1.005–1.030)

## 2016-12-07 LAB — BASIC METABOLIC PANEL
ANION GAP: 12 (ref 5–15)
BUN: 5 mg/dL — ABNORMAL LOW (ref 6–20)
CO2: 23 mmol/L (ref 22–32)
Calcium: 9.5 mg/dL (ref 8.9–10.3)
Chloride: 101 mmol/L (ref 101–111)
Creatinine, Ser: 0.57 mg/dL (ref 0.44–1.00)
GLUCOSE: 98 mg/dL (ref 65–99)
POTASSIUM: 3.2 mmol/L — AB (ref 3.5–5.1)
Sodium: 136 mmol/L (ref 135–145)

## 2016-12-07 LAB — URINALYSIS, MICROSCOPIC (REFLEX)

## 2016-12-07 LAB — HCG, QUANTITATIVE, PREGNANCY: hCG, Beta Chain, Quant, S: 40125 m[IU]/mL — ABNORMAL HIGH (ref <5)

## 2016-12-07 LAB — WET PREP, GENITAL
SPERM: NONE SEEN
TRICH WET PREP: NONE SEEN
YEAST WET PREP: NONE SEEN

## 2016-12-07 LAB — PREGNANCY, URINE: Preg Test, Ur: POSITIVE — AB

## 2016-12-07 MED ORDER — SODIUM CHLORIDE 0.9 % IV BOLUS (SEPSIS)
1000.0000 mL | Freq: Once | INTRAVENOUS | Status: AC
  Administered 2016-12-07: 1000 mL via INTRAVENOUS

## 2016-12-07 MED ORDER — METRONIDAZOLE 500 MG PO TABS: 500.0000 mg | ORAL_TABLET | Freq: Two times a day (BID) | ORAL | 0 refills | Status: DC

## 2016-12-07 MED ORDER — PROMETHAZINE HCL 25 MG/ML IJ SOLN
12.5000 mg | Freq: Once | INTRAMUSCULAR | Status: AC
  Administered 2016-12-07: 12.5 mg via INTRAVENOUS
  Filled 2016-12-07: qty 1

## 2016-12-07 NOTE — ED Provider Notes (Signed)
[facility]   CSN: [id number] Arrival date & time: 12/07/16  1714     History   Chief Complaint Chief Complaint  Patient presents with  . Pelvic Pain    HPI Julia Goodwin is a 21 y.o. female.  HPI Patient presents with nausea and some vomiting for the last 4 days. Also now has lower pelvic pain. Been worse last couple days. She is not having diarrhea. Pain is dull. No dysuria. No fevers or chills. She is a few days late for her menses. States she is normally not late at all. She has had some greenish vaginal discharge. Somewhat decreased appetite.   Past Medical History:  Diagnosis Date  . Headache     There are no active problems to display for this patient.   History reviewed. No pertinent surgical history.  OB History    No data available       Home Medications    Prior to Admission medications   Medication Sig Start Date End Date Taking? Authorizing Provider  azithromycin (ZITHROMAX) 250 MG tablet Take 1 tablet (250 mg total) by mouth daily. 4 tablets by mouth. One dose 12/22/15   Doug Sou, MD  fluconazole (DIFLUCAN) 150 MG tablet Take 1 tablet (150 mg total) by mouth once. 12/22/15   Doug Sou, MD  metroNIDAZOLE (FLAGYL) 500 MG tablet Take 1 tablet (500 mg total) by mouth 2 (two) times daily. 12/07/16   Benjiman Core, MD    Family History History reviewed. No pertinent family history.  Social History Social History  Substance Use Topics  . Smoking status: Never Smoker  . Smokeless tobacco: Never Used  . Alcohol use No     Allergies   Patient has no known allergies.   Review of Systems Review of Systems  Constitutional: Positive for appetite change.  HENT: Negative for congestion.   Eyes: Negative for visual disturbance.  Respiratory: Negative for shortness of breath.   Gastrointestinal: Positive for abdominal pain, nausea and vomiting. Negative for constipation.  Endocrine: Negative for polyuria.    Genitourinary: Positive for vaginal discharge. Negative for vaginal bleeding.  Musculoskeletal: Negative for back pain and gait problem.  Skin: Negative for wound.  Neurological: Negative for light-headedness.  Hematological: Negative for adenopathy.  Psychiatric/Behavioral: Negative for confusion.     Physical Exam Updated Vital Signs BP 110/65 (BP Location: Left Arm)   Pulse 71   Temp 99.1 F (37.3 C) (Oral)   Resp 18   Ht  (1.549 m)   Wt 116 lb (52.6 kg)   LMP 10/31/2016   SpO2 100%   BMI 21.92 kg/m   Physical Exam  Constitutional: She appears well-developed.  HENT:  Head: Atraumatic.  Eyes: Pupils are equal, round, and reactive to light.  Cardiovascular: Normal rate.   Pulmonary/Chest: Effort normal.  Abdominal: She exhibits no mass. There is tenderness.  Mild suprapubic tenderness without rebound or guarding.  Musculoskeletal: Normal range of motion.  Neurological: She is alert.  Skin: Skin is warm. Capillary refill takes less than 2 seconds. No pallor.  Psychiatric: She has a normal mood and affect.  pelvic. Os closed. No CMT. Slight white vaginal discharge.   ED Treatments / Results  Labs (all labs ordered are listed, but only abnormal results are displayed) Labs Reviewed  WET PREP, GENITAL - Abnormal; Notable for the following:       Result Value   Clue Cells Wet Prep HPF POC PRESENT (*)    WBC, Wet  Prep HPF POC MANY (*)    All other components within normal limits  URINALYSIS, ROUTINE W REFLEX MICROSCOPIC - Abnormal; Notable for the following:    APPearance CLOUDY (*)    Ketones, ur >80 (*)    Leukocytes, UA MODERATE (*)    All other components within normal limits  PREGNANCY, URINE - Abnormal; Notable for the following:    Preg Test, Ur POSITIVE (*)    All other components within normal limits  URINALYSIS, MICROSCOPIC (REFLEX) - Abnormal; Notable for the following:    Bacteria, UA FEW (*)    Squamous Epithelial / LPF 6-30 (*)    All other  components within normal limits  BASIC METABOLIC PANEL - Abnormal; Notable for the following:    Potassium 3.2 (*)    BUN 5 (*)    All other components within normal limits  CBC WITH DIFFERENTIAL/PLATELET - Abnormal; Notable for the following:    WBC 11.6 (*)    Neutro Abs 8.3 (*)    All other components within normal limits  HCG, QUANTITATIVE, PREGNANCY - Abnormal; Notable for the following:    hCG, Beta Chain, Quant, S 40,125 (*)    All other components within normal limits  RPR  HIV ANTIBODY (ROUTINE TESTING)  GC/CHLAMYDIA PROBE AMP ([place]) NOT AT [facility]    EKG  EKG Interpretation None       Radiology US Ob Comp < 14 Wks  Result Date: 12/07/2016 CLINICAL DATA:  Acute onset of pelvic pain, nausea and vomiting. Initial encounter. EXAM: OBSTETRIC <14 WK [place] AND TRANSVAGINAL OB US TECHNIQUE: Both transabdominal and transvaginal ultrasound examinations were performed for complete evaluation of the gestation as well as the maternal uterus, adnexal regions, and pelvic cul-de-sac. Transvaginal technique was performed to assess early pregnancy. COMPARISON:  CT of the abdomen and pelvis performed 05/15/2015 FINDINGS: Intrauterine gestational sac: Single; visualized and normal in shape. Yolk sac:  Yes Embryo:  Yes Cardiac Activity: Yes Heart Rate: 95  bpm CRL:  4.5  mm   6 w   1 d                  [place] EDC: 08/01/2017 Subchorionic hemorrhage:  None visualized. Maternal uterus/adnexae: The ovaries are unremarkable in appearance. The right ovary measures 3.1 x 1.9 x 2.7 cm, while the left ovary measures 3.4 x 3.2 x 1.8 cm. No suspicious adnexal masses are seen; there is no evidence for ovarian torsion. A small amount of free fluid is seen within the pelvic cul-de-sac. IMPRESSION: Single live intrauterine pregnancy noted, with a crown-rump length of 5 mm, corresponding to a gestational age of [redacted] weeks 1 day. This matches the gestational age of [redacted] weeks 3 days by LMP, reflecting an estimated date of  delivery of August 06, 2017. Electronically Signed   By: Roanna Raider M.D.   On: 12/07/2016 20:02   US Ob Transvaginal  Result Date: 12/07/2016 CLINICAL DATA:  Acute onset of pelvic pain, nausea and vomiting. Initial encounter. EXAM: OBSTETRIC <14 WK [place] AND TRANSVAGINAL OB US TECHNIQUE: Both transabdominal and transvaginal ultrasound examinations were performed for complete evaluation of the gestation as well as the maternal uterus, adnexal regions, and pelvic cul-de-sac. Transvaginal technique was performed to assess early pregnancy. COMPARISON:  CT of the abdomen and pelvis performed 05/15/2015 FINDINGS: Intrauterine gestational sac: Single; visualized and normal in shape. Yolk sac:  Yes Embryo:  Yes Cardiac Activity: Yes Heart Rate: 95  bpm CRL:  4.5  mm  6 w   1 d                  [place] EDC: 08/01/2017 Subchorionic hemorrhage:  None visualized. Maternal uterus/adnexae: The ovaries are unremarkable in appearance. The right ovary measures 3.1 x 1.9 x 2.7 cm, while the left ovary measures 3.4 x 3.2 x 1.8 cm. No suspicious adnexal masses are seen; there is no evidence for ovarian torsion. A small amount of free fluid is seen within the pelvic cul-de-sac. IMPRESSION: Single live intrauterine pregnancy noted, with a crown-rump length of 5 mm, corresponding to a gestational age of [redacted] weeks 1 day. This matches the gestational age of [redacted] weeks 3 days by LMP, reflecting an estimated date of delivery of August 06, 2017. Electronically Signed   By: Roanna Raider M.D.   On: 12/07/2016 20:02    Procedures Procedures (including critical care time)  Medications Ordered in ED Medications  sodium chloride 0.9 % bolus 1,000 mL (0 mLs Intravenous Stopped 12/07/16 1923)  sodium chloride 0.9 % bolus 1,000 mL (0 mLs Intravenous Stopped 12/07/16 2109)  promethazine (PHENERGAN) injection 12.5 mg (12.5 mg Intravenous Given 12/07/16 2002)     Initial Impression / Assessment and Plan / ED Course  I have reviewed the  triage vital signs and the nursing notes.  Pertinent labs & imaging results that were available during my care of the patient were reviewed by me and considered in my medical decision making (see chart for details).     pateitn with nausea and vomiting. Pregnant. US shows intrauterine pregnancy. Likely BV. Will treat.   Final Clinical Impressions(s) / ED Diagnoses   Final diagnoses:  Intrauterine pregnancy  Bacterial vaginosis    New Prescriptions Discharge Medication List as of 12/07/2016  8:55 PM    START taking these medications   Details  metroNIDAZOLE (FLAGYL) 500 MG tablet Take 1 tablet (500 mg total) by mouth 2 (two) times daily., Starting Wed 12/07/2016, Print         Benjiman Core, MD 12/08/16 [phone]

## 2016-12-07 NOTE — ED Notes (Addendum)
Patient transported to us 

## 2016-12-07 NOTE — ED Triage Notes (Signed)
Patient states that she is a few days late on her period, also having Nausea. The patient reports that she is having lower pelvic pain, worse over the last 2 days

## 2016-12-08 LAB — HIV ANTIBODY (ROUTINE TESTING W REFLEX): HIV SCREEN 4TH GENERATION: NONREACTIVE

## 2016-12-08 LAB — GC/CHLAMYDIA PROBE AMP (~~LOC~~) NOT AT ARMC
CHLAMYDIA, DNA PROBE: NEGATIVE
Neisseria Gonorrhea: NEGATIVE

## 2016-12-08 LAB — RPR: RPR: NONREACTIVE

## 2016-12-09 ENCOUNTER — Inpatient Hospital Stay (HOSPITAL_COMMUNITY)
Admission: AD | Admit: 2016-12-09 | Discharge: 2016-12-09 | Disposition: A | Discharge status: Home / Self Care | Payer: Self-pay | Source: Ambulatory Visit | Attending: Family Medicine | Admitting: Family Medicine

## 2016-12-09 ENCOUNTER — Encounter (HOSPITAL_COMMUNITY): Payer: Self-pay | Admitting: *Deleted

## 2016-12-09 DIAGNOSIS — Z3A01 Less than 8 weeks gestation of pregnancy: Secondary | ICD-10-CM | POA: Insufficient documentation

## 2016-12-09 DIAGNOSIS — O26891 Other specified pregnancy related conditions, first trimester: Secondary | ICD-10-CM | POA: Insufficient documentation

## 2016-12-09 DIAGNOSIS — O219 Vomiting of pregnancy, unspecified: Secondary | ICD-10-CM

## 2016-12-09 DIAGNOSIS — R112 Nausea with vomiting, unspecified: Secondary | ICD-10-CM | POA: Insufficient documentation

## 2016-12-09 DIAGNOSIS — Z79899 Other long term (current) drug therapy: Secondary | ICD-10-CM | POA: Insufficient documentation

## 2016-12-09 DIAGNOSIS — Z87891 Personal history of nicotine dependence: Secondary | ICD-10-CM | POA: Insufficient documentation

## 2016-12-09 HISTORY — DX: Cardiac murmur, unspecified: R01.1

## 2016-12-09 LAB — URINALYSIS, ROUTINE W REFLEX MICROSCOPIC
Bilirubin Urine: NEGATIVE
Glucose, UA: NEGATIVE mg/dL
KETONES UR: 80 mg/dL — AB
Nitrite: NEGATIVE
PH: 5 (ref 5.0–8.0)
PROTEIN: 30 mg/dL — AB
Specific Gravity, Urine: 1.029 (ref 1.005–1.030)

## 2016-12-09 LAB — CBC
HEMATOCRIT: 35.6 % — AB (ref 36.0–46.0)
HEMOGLOBIN: 12.2 g/dL (ref 12.0–15.0)
MCH: 32.7 pg (ref 26.0–34.0)
MCHC: 34.3 g/dL (ref 30.0–36.0)
MCV: 95.4 fL (ref 78.0–100.0)
Platelets: 197 10*3/uL (ref 150–400)
RBC: 3.73 MIL/uL — AB (ref 3.87–5.11)
RDW: 12.5 % (ref 11.5–15.5)
WBC: 14.2 10*3/uL — ABNORMAL HIGH (ref 4.0–10.5)

## 2016-12-09 LAB — COMPREHENSIVE METABOLIC PANEL
ALBUMIN: 4.4 g/dL (ref 3.5–5.0)
ALK PHOS: 49 U/L (ref 38–126)
ALT: 12 U/L — AB (ref 14–54)
AST: 19 U/L (ref 15–41)
Anion gap: 10 (ref 5–15)
BILIRUBIN TOTAL: 0.5 mg/dL (ref 0.3–1.2)
BUN: 7 mg/dL (ref 6–20)
CALCIUM: 9.8 mg/dL (ref 8.9–10.3)
CO2: 22 mmol/L (ref 22–32)
CREATININE: 0.52 mg/dL (ref 0.44–1.00)
Chloride: 102 mmol/L (ref 101–111)
GFR calc Af Amer: >60 mL/min (ref >60)
GFR calc non Af Amer: >60 mL/min (ref >60)
GLUCOSE: 100 mg/dL — AB (ref 65–99)
Potassium: 3.7 mmol/L (ref 3.5–5.1)
SODIUM: 134 mmol/L — AB (ref 135–145)
Total Protein: 7.1 g/dL (ref 6.5–8.1)

## 2016-12-09 MED ORDER — RANITIDINE HCL 150 MG PO TABS: 150.0000 mg | ORAL_TABLET | Freq: Two times a day (BID) | ORAL | 0 refills | Status: DC

## 2016-12-09 MED ORDER — PROMETHAZINE HCL 25 MG PO TABS: 25.0000 mg | ORAL_TABLET | Freq: Four times a day (QID) | ORAL | 0 refills | Status: DC | PRN

## 2016-12-09 MED ORDER — FAMOTIDINE IN NACL 20-0.9 MG/50ML-% IV SOLN
20.0000 mg | Freq: Once | INTRAVENOUS | Status: AC
  Administered 2016-12-09: 20 mg via INTRAVENOUS
  Filled 2016-12-09: qty 50

## 2016-12-09 MED ORDER — PROMETHAZINE HCL 25 MG/ML IJ SOLN
25.0000 mg | Freq: Once | INTRAVENOUS | Status: AC
  Administered 2016-12-09: 25 mg via INTRAVENOUS
  Filled 2016-12-09: qty 1

## 2016-12-09 NOTE — MAU Provider Note (Signed)
History     CSN: [id number]  Arrival date and time: 12/09/16 1406  First Provider Initiated Contact with Patient 12/09/16 1453      Chief Complaint  Patient presents with  . Emesis During Pregnancy   HPI Julia Goodwin is a 21 y.o. G1P0 at [redacted]w[redacted]d who presents with n/v. Symptoms began 1 week ago. Was seen at ED 2 days ago for same complaint; confirmed IUP during that visit but didn't rx antiemetics. Reports vomiting 10+ times since this morning. Vomit is mostly yellow but has also had bloody streaks in it. Denies abdominal pain, heartburn, diarrhea, constipation, or vaginal bleeding.   OB History    Gravida Para Term Preterm AB Living   1             SAB TAB Ectopic Multiple Live Births                  Past Medical History:  Diagnosis Date  . Headache   . Heart murmur     History reviewed. No pertinent surgical history.  History reviewed. No pertinent family history.  Social History  Substance Use Topics  . Smoking status: Former Games developer  . Smokeless tobacco: Never Used  . Alcohol use No    Allergies: No Known Allergies  Prescriptions Prior to Admission  Medication Sig Dispense Refill Last Dose  . azithromycin (ZITHROMAX) 250 MG tablet Take 1 tablet (250 mg total) by mouth daily. 4 tablets by mouth. One dose 4 tablet 0   . fluconazole (DIFLUCAN) 150 MG tablet Take 1 tablet (150 mg total) by mouth once. 1 tablet 0   . metroNIDAZOLE (FLAGYL) 500 MG tablet Take 1 tablet (500 mg total) by mouth 2 (two) times daily. 14 tablet 0     Review of Systems  Constitutional: Negative.   Gastrointestinal: Positive for nausea and vomiting. Negative for abdominal pain, constipation and diarrhea.  Genitourinary: Negative.    Physical Exam   Blood pressure 93/64, pulse 81, temperature 99 F (37.2 C), temperature source Oral, resp. rate 16, height  (1.549 m), weight 97 lb (44 kg), last menstrual period 10/31/2016, SpO2 100 %.  Physical Exam  Nursing note and vitals  reviewed. Constitutional: She is oriented to person, place, and time. She appears well-developed and well-nourished. No distress.  HENT:  Head: Normocephalic and atraumatic.  Eyes: Conjunctivae are normal. Right eye exhibits no discharge. Left eye exhibits no discharge. No scleral icterus.  Neck: Normal range of motion.  Cardiovascular: Normal rate, regular rhythm and normal heart sounds.   No murmur heard. Respiratory: Effort normal and breath sounds normal. No respiratory distress. She has no wheezes.  GI: Soft. Bowel sounds are normal. She exhibits no distension. There is no tenderness.  Neurological: She is alert and oriented to person, place, and time.  Skin: Skin is warm and dry. She is not diaphoretic.  Psychiatric: She has a normal mood and affect. Her behavior is normal. Judgment and thought content normal.    MAU Course  Procedures Results for orders placed or performed during the hospital encounter of 12/09/16 (from the past 24 hour(s))  Urinalysis, Routine w reflex microscopic     Status: Abnormal   Collection Time: 12/09/16  2:11 PM  Result Value Ref Range   Color, Urine YELLOW YELLOW   APPearance HAZY (A) CLEAR   Specific Gravity, Urine 1.029 1.005 - 1.030   pH 5.0 5.0 - 8.0   Glucose, UA NEGATIVE NEGATIVE mg/dL   Hgb urine dipstick  SMALL (A) NEGATIVE   Bilirubin Urine NEGATIVE NEGATIVE   Ketones, ur 80 (A) NEGATIVE mg/dL   Protein, ur 30 (A) NEGATIVE mg/dL   Nitrite NEGATIVE NEGATIVE   Leukocytes, UA TRACE (A) NEGATIVE   RBC / HPF 0-5 0 - 5 RBC/hpf   WBC, UA 0-5 0 - 5 WBC/hpf   Bacteria, UA RARE (A) NONE SEEN   Squamous Epithelial / LPF 0-5 (A) NONE SEEN   Mucous PRESENT   CBC     Status: Abnormal   Collection Time: 12/09/16  3:45 PM  Result Value Ref Range   WBC 14.2 (H) 4.0 - 10.5 K/uL   RBC 3.73 (L) 3.87 - 5.11 MIL/uL   Hemoglobin 12.2 12.0 - 15.0 g/dL   HCT 16.1 (L) 09.6 - 04.5 %   MCV 95.4 78.0 - 100.0 fL   MCH 32.7 26.0 - 34.0 pg   MCHC 34.3 30.0 -  36.0 g/dL   RDW 40.9 81.1 - 91.4 %   Platelets 197 150 - 400 K/uL  Comprehensive metabolic panel     Status: Abnormal   Collection Time: 12/09/16  3:45 PM  Result Value Ref Range   Sodium 134 (L) 135 - 145 mmol/L   Potassium 3.7 3.5 - 5.1 mmol/L   Chloride 102 101 - 111 mmol/L   CO2 22 22 - 32 mmol/L   Glucose, Bld 100 (H) 65 - 99 mg/dL   BUN 7 6 - 20 mg/dL   Creatinine, Ser 7.82 0.44 - 1.00 mg/dL   Calcium 9.8 8.9 - 95.6 mg/dL   Total Protein 7.1 6.5 - 8.1 g/dL   Albumin 4.4 3.5 - 5.0 g/dL   AST 19 15 - 41 U/L   ALT 12 (L) 14 - 54 U/L   Alkaline Phosphatase 49 38 - 126 U/L   Total Bilirubin 0.5 0.3 - 1.2 mg/dL   GFR calc non Af Amer >60 >60 mL/min   GFR calc Af Amer >60 >60 mL/min   Anion gap 10 5 - 15    MDM IV started Phenergan 25 mg in bag of D5LR Pepcid 20 mg IV Pt no longer vomiting after meds & able to keep down ice -- declines drink or crackers Assessment and Plan  A; 1. Nausea and vomiting during pregnancy prior to [redacted] weeks gestation    P: Discharge home Rx phenergan & zantac Discussed reasons to return to MAU Start prenatal care  [name] 12/09/2016, 2:53 PM

## 2016-12-09 NOTE — Discharge Instructions (Signed)
Morning Sickness Morning sickness is when you feel sick to your stomach (nauseous) during pregnancy. This nauseous feeling may or may not come with vomiting. It often occurs in the morning but can be a problem any time of day. Morning sickness is most common during the first trimester, but it may continue throughout pregnancy. While morning sickness is unpleasant, it is usually harmless unless you develop severe and continual vomiting (hyperemesis gravidarum). This condition requires more intense treatment. What are the causes? The cause of morning sickness is not completely known but seems to be related to normal hormonal changes that occur in pregnancy. What increases the risk? You are at greater risk if you:  Experienced nausea or vomiting before your pregnancy.  Had morning sickness during a previous pregnancy.  Are pregnant with more than one baby, such as twins. How is this treated? Do not use any medicines (prescription, over-the-counter, or herbal) for morning sickness without first talking to your health care provider. Your health care provider may prescribe or recommend:  Vitamin B6 supplements.  Anti-nausea medicines.  The herbal medicine ginger. Follow these instructions at home:  Only take over-the-counter or prescription medicines as directed by your health care provider.  Taking multivitamins before getting pregnant can prevent or decrease the severity of morning sickness in most women.  Eat a piece of dry toast or unsalted crackers before getting out of bed in the morning.  Eat five or six small meals a day.  Eat dry and bland foods (rice, baked potato). Foods high in carbohydrates are often helpful.  Do not drink liquids with your meals. Drink liquids between meals.  Avoid greasy, fatty, and spicy foods.  Get someone to cook for you if the smell of any food causes nausea and vomiting.  If you feel nauseous after taking prenatal vitamins, take the vitamins at  night or with a snack.  Snack on protein foods (nuts, yogurt, cheese) between meals if you are hungry.  Eat unsweetened gelatins for desserts.  Wearing an acupressure wristband (worn for sea sickness) may be helpful.  Acupuncture may be helpful.  Do not smoke.  Get a humidifier to keep the air in your house free of odors.  Get plenty of fresh air. Contact a health care provider if:  Your home remedies are not working, and you need medicine.  You feel dizzy or lightheaded.  You are losing weight. Get help right away if:  You have persistent and uncontrolled nausea and vomiting.  You pass out (faint). This information is not intended to replace advice given to you by your health care provider. Make sure you discuss any questions you have with your health care provider. Document Released: 09/29/2006 Document Revised: 01/14/2016 Document Reviewed: 01/23/2013 Elsevier Interactive Patient Education  2017 Elsevier Inc.    Safe Medications in Pregnancy   Acne: Benzoyl Peroxide Salicylic Acid  Backache/Headache: Tylenol: 2 regular strength every 4 hours OR              2 Extra strength every 6 hours  Colds/Coughs/Allergies: Benadryl (alcohol free) 25 mg every 6 hours as needed Breath right strips Claritin Cepacol throat lozenges Chloraseptic throat spray Cold-Eeze- up to three times per day Cough drops, alcohol free Flonase (by prescription only) Guaifenesin Mucinex Robitussin DM (plain only, alcohol free) Saline nasal spray/drops Sudafed (pseudoephedrine) & Actifed ** use only after [redacted] weeks gestation and if you do not have high blood pressure Tylenol Vicks Vaporub Zinc lozenges Zyrtec   Constipation: Colace Ducolax suppositories Fleet  enema Glycerin suppositories Metamucil Milk of magnesia Miralax Senokot Smooth move tea  Diarrhea: Kaopectate Imodium A-D  *NO pepto Bismol  Hemorrhoids: Anusol Anusol HC Preparation  H Tucks  Indigestion: Tums Maalox Mylanta Zantac  Pepcid  Insomnia: Benadryl (alcohol free)  every 6 hours as needed Tylenol PM Unisom, no Gelcaps  Leg Cramps: Tums MagGel  Nausea/Vomiting:  Bonine Dramamine Emetrol Ginger extract Sea bands Meclizine  Nausea medication to take during pregnancy:  Unisom (doxylamine succinate 25 mg tablets) Take one tablet daily at bedtime. If symptoms are not adequately controlled, the dose can be increased to a maximum recommended dose of two tablets daily (1/2 tablet in the morning, 1/2 tablet mid-afternoon and one at bedtime). Vitamin B6  tablets. Take one tablet twice a day (up to 200 mg per day).  Skin Rashes: Aveeno products Benadryl cream or  every 6 hours as needed Calamine Lotion 1% cortisone cream  Yeast infection: Gyne-lotrimin 7 Monistat 7  Gum/tooth pain: Anbesol  **If taking multiple medications, please check labels to avoid duplicating the same active ingredients **take medication as directed on the label ** Do not exceed 4000 mg of tylenol in 24 hours **Do not take medications that contain aspirin or ibuprofen       [facility] Prenatal Care Providers   Center for [facility] at [facility]       Phone: [phone]  Center for [facility] at [place] Phone: [phone]  Center for [facility] at [place]  Phone: [phone]  [facility] for [facility] at [employer]  Phone: [phone]  Center for [facility] at [place]  Phone: [phone]  [facility]       Phone: [phone]  [facility]    Phone: [phone]   [facility] [place])    Phone: [phone]  [facility]    Phone: [phone]  [facility]    Phone: [phone]   [facility]  Phone: [phone]  [facility]    Phone: [phone]  [facility]   Phone: [phone]  [facility]    Phone: [phone]

## 2016-12-09 NOTE — MAU Note (Signed)
Patient having nausea for past few days with yesterday being worse.  Vomiting more than 10 times in 24 hrs.  Had diarrhea x2 yesterday.  Afebrile.  Patient vomiting "chunks of blood" starting today.  Last able to tolerate PO last night.

## 2017-01-22 ENCOUNTER — Emergency Department (HOSPITAL_BASED_OUTPATIENT_CLINIC_OR_DEPARTMENT_OTHER)
Admission: EM | Admit: 2017-01-22 | Discharge: 2017-01-22 | Disposition: A | Discharge status: Home / Self Care | Payer: Self-pay | Attending: Dermatology | Admitting: Dermatology

## 2017-01-22 ENCOUNTER — Encounter (HOSPITAL_BASED_OUTPATIENT_CLINIC_OR_DEPARTMENT_OTHER): Payer: Self-pay | Admitting: *Deleted

## 2017-01-22 DIAGNOSIS — Z5321 Procedure and treatment not carried out due to patient leaving prior to being seen by health care provider: Secondary | ICD-10-CM | POA: Insufficient documentation

## 2017-01-22 DIAGNOSIS — Z87891 Personal history of nicotine dependence: Secondary | ICD-10-CM | POA: Insufficient documentation

## 2017-01-22 DIAGNOSIS — R109 Unspecified abdominal pain: Secondary | ICD-10-CM | POA: Insufficient documentation

## 2017-01-22 DIAGNOSIS — R197 Diarrhea, unspecified: Secondary | ICD-10-CM | POA: Insufficient documentation

## 2017-01-22 DIAGNOSIS — R112 Nausea with vomiting, unspecified: Secondary | ICD-10-CM | POA: Insufficient documentation

## 2017-01-22 LAB — CBG MONITORING, ED: Glucose-Capillary: 126 mg/dL — ABNORMAL HIGH (ref 65–99)

## 2017-01-22 NOTE — ED Triage Notes (Addendum)
Pt reports N/V/D x several hours. States that she had an elective abortion 3 weeks ago, has had slight vaginal discharge and spotting since that time. Denies fever.  Pt does report that she is vomiting blood-drank cran-pineapple juice.

## 2017-02-10 ENCOUNTER — Encounter (HOSPITAL_BASED_OUTPATIENT_CLINIC_OR_DEPARTMENT_OTHER): Payer: Self-pay | Admitting: *Deleted

## 2017-02-10 ENCOUNTER — Emergency Department (HOSPITAL_BASED_OUTPATIENT_CLINIC_OR_DEPARTMENT_OTHER)
Admission: EM | Admit: 2017-02-10 | Discharge: 2017-02-10 | Disposition: A | Discharge status: Home / Self Care | Payer: Self-pay | Attending: Emergency Medicine | Admitting: Emergency Medicine

## 2017-02-10 DIAGNOSIS — Z87891 Personal history of nicotine dependence: Secondary | ICD-10-CM | POA: Insufficient documentation

## 2017-02-10 DIAGNOSIS — R112 Nausea with vomiting, unspecified: Secondary | ICD-10-CM | POA: Insufficient documentation

## 2017-02-10 LAB — URINALYSIS, ROUTINE W REFLEX MICROSCOPIC
GLUCOSE, UA: NEGATIVE mg/dL
HGB URINE DIPSTICK: NEGATIVE
Ketones, ur: 40 mg/dL — AB
NITRITE: NEGATIVE
PH: 7.5 (ref 5.0–8.0)
Protein, ur: 100 mg/dL — AB
SPECIFIC GRAVITY, URINE: 1.036 — AB (ref 1.005–1.030)

## 2017-02-10 LAB — URINALYSIS, MICROSCOPIC (REFLEX)

## 2017-02-10 LAB — CBC WITH DIFFERENTIAL/PLATELET
BASOS ABS: 0 10*3/uL (ref 0.0–0.1)
BASOS PCT: 0 %
Eosinophils Absolute: 0 10*3/uL (ref 0.0–0.7)
Eosinophils Relative: 0 %
HEMATOCRIT: 39.1 % (ref 36.0–46.0)
Hemoglobin: 13.4 g/dL (ref 12.0–15.0)
Lymphocytes Relative: 16 %
Lymphs Abs: 1.7 10*3/uL (ref 0.7–4.0)
MCH: 33.5 pg (ref 26.0–34.0)
MCHC: 34.3 g/dL (ref 30.0–36.0)
MCV: 97.8 fL (ref 78.0–100.0)
MONO ABS: 0.8 10*3/uL (ref 0.1–1.0)
Monocytes Relative: 8 %
NEUTROS ABS: 7.7 10*3/uL (ref 1.7–7.7)
Neutrophils Relative %: 76 %
PLATELETS: 185 10*3/uL (ref 150–400)
RBC: 4 MIL/uL (ref 3.87–5.11)
RDW: 12.6 % (ref 11.5–15.5)
WBC: 10.2 10*3/uL (ref 4.0–10.5)

## 2017-02-10 LAB — BASIC METABOLIC PANEL
ANION GAP: 14 (ref 5–15)
BUN: 10 mg/dL (ref 6–20)
CALCIUM: 9.6 mg/dL (ref 8.9–10.3)
CO2: 21 mmol/L — AB (ref 22–32)
Chloride: 103 mmol/L (ref 101–111)
Creatinine, Ser: 0.81 mg/dL (ref 0.44–1.00)
GFR calc non Af Amer: >60 mL/min (ref >60)
Glucose, Bld: 113 mg/dL — ABNORMAL HIGH (ref 65–99)
Potassium: 2.9 mmol/L — ABNORMAL LOW (ref 3.5–5.1)
Sodium: 138 mmol/L (ref 135–145)

## 2017-02-10 LAB — PREGNANCY, URINE: Preg Test, Ur: NEGATIVE

## 2017-02-10 MED ORDER — ONDANSETRON 4 MG PO TBDP: 4.0000 mg | ORAL_TABLET | Freq: Three times a day (TID) | ORAL | 0 refills | Status: DC | PRN

## 2017-02-10 MED ORDER — SODIUM CHLORIDE 0.9 % IV BOLUS (SEPSIS)
1000.0000 mL | Freq: Once | INTRAVENOUS | Status: AC
  Administered 2017-02-10: 1000 mL via INTRAVENOUS

## 2017-02-10 MED ORDER — ONDANSETRON HCL 4 MG/2ML IJ SOLN
4.0000 mg | Freq: Once | INTRAMUSCULAR | Status: AC
  Administered 2017-02-10: 4 mg via INTRAVENOUS
  Filled 2017-02-10: qty 2

## 2017-02-10 MED ORDER — POTASSIUM CHLORIDE CRYS ER 20 MEQ PO TBCR
40.0000 meq | EXTENDED_RELEASE_TABLET | Freq: Once | ORAL | Status: AC
  Administered 2017-02-10: 40 meq via ORAL
  Filled 2017-02-10: qty 2

## 2017-02-10 MED ORDER — PROMETHAZINE HCL 25 MG/ML IJ SOLN
12.5000 mg | Freq: Once | INTRAMUSCULAR | Status: AC
  Administered 2017-02-10: 12.5 mg via INTRAVENOUS
  Filled 2017-02-10: qty 1

## 2017-02-10 MED ORDER — SODIUM CHLORIDE 0.9 % IV BOLUS (SEPSIS)
500.0000 mL | Freq: Once | INTRAVENOUS | Status: AC
  Administered 2017-02-10: 500 mL via INTRAVENOUS

## 2017-02-10 NOTE — Discharge Instructions (Signed)
Please read and follow all provided instructions.  Your diagnoses today include:  1. Nausea and vomiting, intractability of vomiting not specified, unspecified vomiting type     Tests performed today include: Vital signs. See below for your results today.   Medications prescribed:  Take as prescribed   Home care instructions:  Follow any educational materials contained in this packet.  Follow-up instructions: Please follow-up with your primary care provider for further evaluation of symptoms and treatment   Return instructions:  Please return to the Emergency Department if you do not get better, if you get worse, or new symptoms OR  - Fever (temperature greater than 101.54F)  - Bleeding that does not stop with holding pressure to the area    -Severe pain (please note that you may be more sore the day after your accident)  - Chest Pain  - Difficulty breathing  - Severe nausea or vomiting  - Inability to tolerate food and liquids  - Passing out  - Skin becoming red around your wounds  - Change in mental status (confusion or lethargy)  - New numbness or weakness    Please return if you have any other emergent concerns.  Additional Information:  Your vital signs today were: BP 127/89 (BP Location: Left Arm)    Pulse (!) 56    Temp 98.3 F (36.8 C) (Oral)    Resp 16    Ht 5\' 1"  (1.549 m)    Wt 45.4 kg (100 lb)    LMP 10/27/2016    SpO2 100%    BMI 18.89 kg/m  If your blood pressure (BP) was elevated above 135/85 this visit, please have this repeated by your doctor within one month. ---------------

## 2017-02-10 NOTE — ED Provider Notes (Signed)
[facility] Provider Note   CSN: [id number] Arrival date & time: 02/10/17  1301     History   Chief Complaint Chief Complaint  Patient presents with  . Emesis    HPI Julia Goodwin is a 21 y.o. female.  Patient presents with 2 days of intractable vomiting. Patient had elective abortion approximately one month ago. She denies any abdominal pains, blood in vomit or stool. No vaginal symptoms. No fevers, chest pain, shortness of breath. No treatments at home. Patient has a history of the same. She does not know why she occasionally has vomiting episodes. Typically fluids and antiemetics will help her. She feels like she is getting dehydrated and is requesting fluids. She reports occasional use of NSAIDs, alcohol, marijuana. No recent heavy use of these. The onset of this condition was acute. The course is constant. Aggravating factors: none. Alleviating factors: none.        Past Medical History:  Diagnosis Date  . Headache   . Heart murmur     There are no active problems to display for this patient.   History reviewed. No pertinent surgical history.  OB History    Gravida Para Term Preterm AB Living   1             SAB TAB Ectopic Multiple Live Births                   Home Medications    Prior to Admission medications   Not on File    Family History History reviewed. No pertinent family history.  Social History Social History  Substance Use Topics  . Smoking status: Former Games developer  . Smokeless tobacco: Never Used  . Alcohol use No     Allergies   Patient has no known allergies.   Review of Systems Review of Systems  Constitutional: Negative for fever.  HENT: Negative for rhinorrhea and sore throat.   Eyes: Negative for redness.  Respiratory: Negative for cough.   Cardiovascular: Negative for chest pain.  Gastrointestinal: Positive for nausea and vomiting. Negative for abdominal pain and diarrhea.  Genitourinary: Negative for  dysuria, frequency, hematuria, vaginal bleeding and vaginal discharge.  Musculoskeletal: Negative for myalgias.  Skin: Negative for rash.  Neurological: Negative for headaches.     Physical Exam Updated Vital Signs BP 127/89 (BP Location: Left Arm)   Pulse (!) 56   Temp 98.3 F (36.8 C) (Oral)   Resp 16   Ht 5\' 1"  (1.549 m)   Wt 45.4 kg (100 lb)   LMP 10/27/2016   SpO2 100%   BMI 18.89 kg/m   Physical Exam  Constitutional: She appears well-developed and well-nourished.  Patient is very thin.  HENT:  Head: Normocephalic and atraumatic.  Slightly dry mucous membranes.  Eyes: Conjunctivae are normal. Right eye exhibits no discharge. Left eye exhibits no discharge.  Neck: Normal range of motion. Neck supple.  Cardiovascular: Normal rate, regular rhythm and normal heart sounds.   Pulmonary/Chest: Effort normal and breath sounds normal. No respiratory distress. She has no wheezes. She has no rales.  Abdominal: Soft. Bowel sounds are normal. There is no tenderness. There is no rebound and no guarding.  No abdominal tenderness exhibited.  Neurological: She is alert.  Skin: Skin is warm and dry.  Psychiatric: She has a normal mood and affect.  Nursing note and vitals reviewed.    ED Treatments / Results  Labs (all labs ordered are listed, but only abnormal results are displayed)  Labs Reviewed  URINALYSIS, ROUTINE W REFLEX MICROSCOPIC - Abnormal; Notable for the following:       Result Value   Color, Urine ORANGE (*)    APPearance CLOUDY (*)    Specific Gravity, Urine 1.036 (*)    Bilirubin Urine SMALL (*)    Ketones, ur 40 (*)    Protein, ur 100 (*)    Leukocytes, UA MODERATE (*)    All other components within normal limits  URINALYSIS, MICROSCOPIC (REFLEX) - Abnormal; Notable for the following:    Bacteria, UA MANY (*)    Squamous Epithelial / LPF 6-30 (*)    All other components within normal limits  PREGNANCY, URINE  CBC WITH DIFFERENTIAL/PLATELET  BASIC  METABOLIC PANEL    Procedures Procedures (including critical care time)  Medications Ordered in ED Medications  sodium chloride 0.9 % bolus 1,000 mL (0 mLs Intravenous Stopped 02/10/17 1507)  ondansetron (ZOFRAN) injection 4 mg (4 mg Intravenous Given 02/10/17 1420)  sodium chloride 0.9 % bolus 500 mL (0 mLs Intravenous Stopped 02/10/17 1625)  promethazine (PHENERGAN) injection 12.5 mg (12.5 mg Intravenous Given 02/10/17 1650)     Initial Impression / Assessment and Plan / ED Course  I have reviewed the triage vital signs and the nursing notes.  Pertinent labs & imaging results that were available during my care of the patient were reviewed by me and considered in my medical decision making (see chart for details).     Patient seen and examined.   Vital signs reviewed and are as follows: BP 127/89 (BP Location: Left Arm)   Pulse (!) 56   Temp 98.3 F (36.8 C) (Oral)   Resp 16   Ht 5\' 1"  (1.549 m)   Wt 45.4 kg (100 lb)   LMP 10/27/2016   SpO2 100%   BMI 18.89 kg/m   Fluids and antiemetics ordered.  Patient initially did well in the emergency department, however failed her oral fluid challenge. Additional antiemetics ordered. Exam is unchanged. Patient has received 1.5 L of normal saline.  Handoff to Qwest CommunicationsMohr PA-C at shift change. Plan: check CBC/BMP. Reassess after additional medications.   Final Clinical Impressions(s) / ED Diagnoses   Final diagnoses:  Nausea and vomiting, intractability of vomiting not specified, unspecified vomiting type    New Prescriptions New Prescriptions   No medications on file     Renne CriglerGeiple, Mailynn Everly, Cordelia Poche-C 02/10/17 1659    Vanetta MuldersZackowski, Scott, MD 02/17/17 [phone]

## 2017-02-10 NOTE — ED Provider Notes (Signed)
  Physical Exam  BP 127/89 (BP Location: Left Arm)   Pulse (!) 56   Temp 98.3 F (36.8 C) (Oral)   Resp 16   Ht 5\' 1"  (1.549 m)   Wt 45.4 kg (100 lb)   LMP 10/27/2016   SpO2 100%   BMI 18.89 kg/m   Physical Exam  ED Course  Procedures  MDM 5:20 PM Sign out from Rhea BleacherJosh Geiple, [place] JerseyPA-C  Per previous provider MDM: Fluids and antiemetics ordered. Patient initially did well in the emergency department, however failed her oral fluid challenge. Additional antiemetics ordered. Exam is unchanged. Patient has received 1.5 L of normal saline. Handoff to [employer]r PA-C at shift change. Plan: check CBC/BMP. Reassess after additional medications.   CBC/BMP Potassium 2.9. Given Kdur and passed oral challenge.  Given Rx Reglan and follow up to PCP   At time of discharge, Patient is in no acute distress. Vital Signs are stable. Patient is able to ambulate. Patient able to tolerate PO.       Audry PiliMohr, Garret Teale, PA-C 02/10/17 1748    Vanetta MuldersZackowski, Scott, MD 02/17/17 [phone]

## 2017-02-10 NOTE — ED Notes (Signed)
Pt has been able to tolerate oral fluids and denies any nausea and vomiting at this time.

## 2017-02-10 NOTE — ED Triage Notes (Signed)
Pt c/o vomiting  X 2 days with HX same

## 2017-02-10 NOTE — ED Notes (Signed)
Per MD gave sprite for fluid challenge

## 2017-02-10 NOTE — ED Notes (Signed)
ED Provider at bedside. 

## 2017-02-13 ENCOUNTER — Encounter (HOSPITAL_BASED_OUTPATIENT_CLINIC_OR_DEPARTMENT_OTHER): Payer: Self-pay | Admitting: *Deleted

## 2017-02-13 ENCOUNTER — Emergency Department (HOSPITAL_BASED_OUTPATIENT_CLINIC_OR_DEPARTMENT_OTHER)
Admission: EM | Admit: 2017-02-13 | Discharge: 2017-02-13 | Disposition: A | Discharge status: Home / Self Care | Payer: Self-pay | Attending: Emergency Medicine | Admitting: Emergency Medicine

## 2017-02-13 DIAGNOSIS — N3000 Acute cystitis without hematuria: Secondary | ICD-10-CM | POA: Insufficient documentation

## 2017-02-13 DIAGNOSIS — R112 Nausea with vomiting, unspecified: Secondary | ICD-10-CM

## 2017-02-13 DIAGNOSIS — Z87891 Personal history of nicotine dependence: Secondary | ICD-10-CM | POA: Insufficient documentation

## 2017-02-13 LAB — URINALYSIS, ROUTINE W REFLEX MICROSCOPIC
Bilirubin Urine: NEGATIVE
Glucose, UA: NEGATIVE mg/dL
Ketones, ur: >80 mg/dL — AB
Nitrite: NEGATIVE
PROTEIN: NEGATIVE mg/dL
SPECIFIC GRAVITY, URINE: 1.016 (ref 1.005–1.030)
pH: 7.5 (ref 5.0–8.0)

## 2017-02-13 LAB — URINALYSIS, MICROSCOPIC (REFLEX)

## 2017-02-13 LAB — CBC WITH DIFFERENTIAL/PLATELET
Basophils Absolute: 0 10*3/uL (ref 0.0–0.1)
Basophils Relative: 0 %
Eosinophils Absolute: 0.1 10*3/uL (ref 0.0–0.7)
Eosinophils Relative: 1 %
HCT: 42.7 % (ref 36.0–46.0)
HEMOGLOBIN: 14.8 g/dL (ref 12.0–15.0)
LYMPHS PCT: 27 %
Lymphs Abs: 2.6 10*3/uL (ref 0.7–4.0)
MCH: 33.5 pg (ref 26.0–34.0)
MCHC: 34.7 g/dL (ref 30.0–36.0)
MCV: 96.6 fL (ref 78.0–100.0)
MONO ABS: 0.9 10*3/uL (ref 0.1–1.0)
MONOS PCT: 9 %
NEUTROS ABS: 6.2 10*3/uL (ref 1.7–7.7)
Neutrophils Relative %: 63 %
PLATELETS: 181 10*3/uL (ref 150–400)
RBC: 4.42 MIL/uL (ref 3.87–5.11)
RDW: 12.3 % (ref 11.5–15.5)
WBC: 9.7 10*3/uL (ref 4.0–10.5)

## 2017-02-13 LAB — LIPASE, BLOOD: Lipase: 25 U/L (ref 11–51)

## 2017-02-13 LAB — COMPREHENSIVE METABOLIC PANEL
ALBUMIN: 5 g/dL (ref 3.5–5.0)
ALT: 12 U/L — AB (ref 14–54)
ANION GAP: 13 (ref 5–15)
AST: 17 U/L (ref 15–41)
Alkaline Phosphatase: 55 U/L (ref 38–126)
BUN: 10 mg/dL (ref 6–20)
CHLORIDE: 103 mmol/L (ref 101–111)
CO2: 23 mmol/L (ref 22–32)
CREATININE: 0.88 mg/dL (ref 0.44–1.00)
Calcium: 9.9 mg/dL (ref 8.9–10.3)
GFR calc Af Amer: >60 mL/min (ref >60)
GFR calc non Af Amer: >60 mL/min (ref >60)
GLUCOSE: 96 mg/dL (ref 65–99)
Potassium: 3 mmol/L — ABNORMAL LOW (ref 3.5–5.1)
SODIUM: 139 mmol/L (ref 135–145)
Total Bilirubin: 1.2 mg/dL (ref 0.3–1.2)
Total Protein: 7.8 g/dL (ref 6.5–8.1)

## 2017-02-13 LAB — PREGNANCY, URINE: PREG TEST UR: NEGATIVE

## 2017-02-13 MED ORDER — ONDANSETRON HCL 4 MG/2ML IJ SOLN
4.0000 mg | Freq: Once | INTRAMUSCULAR | Status: AC
  Administered 2017-02-13: 4 mg via INTRAVENOUS
  Filled 2017-02-13: qty 2

## 2017-02-13 MED ORDER — SODIUM CHLORIDE 0.9 % IV BOLUS (SEPSIS)
1000.0000 mL | Freq: Once | INTRAVENOUS | Status: AC
  Administered 2017-02-13: 1000 mL via INTRAVENOUS

## 2017-02-13 MED ORDER — FOSFOMYCIN TROMETHAMINE 3 G PO PACK
3.0000 g | PACK | Freq: Once | ORAL | Status: AC
  Administered 2017-02-13: 3 g via ORAL
  Filled 2017-02-13: qty 3

## 2017-02-13 MED ORDER — ONDANSETRON HCL 4 MG PO TABS: 4.0000 mg | ORAL_TABLET | Freq: Three times a day (TID) | ORAL | 0 refills | Status: DC | PRN

## 2017-02-13 NOTE — ED Triage Notes (Signed)
Pt reports ongoing n/v, seen here for same last Thursday, wasn't able to fill her rx for zofran as she could not afford it. Pt states she cont with n/v, denies abd pain, "just sore from vomiting." pt states she cannot retain any po.

## 2017-02-13 NOTE — ED Notes (Signed)
Patient understood discharge instructions.  She is A & O x4. 

## 2017-02-13 NOTE — ED Notes (Signed)
ED Provider at bedside. 

## 2017-02-13 NOTE — Discharge Instructions (Signed)
Please use the nausea medicine to prevent vomiting and stay hydrated. Please drink lots of fluid. Please follow-up with a primary care physician for further management of your symptoms. We gave you antibiotics today to treat a urinary tract infection. If any symptoms change or worsen, please return to the nearest emergency department.

## 2017-02-13 NOTE — ED Provider Notes (Signed)
[facility] Provider [facility]   CSN: [id number] Arrival date & time: 02/13/17  8295     History   Chief Complaint Chief Complaint  Patient presents with  . Nausea    HPI Julia Goodwin is a 21 y.o. female.  The history is provided by the patient and medical records. No language interpreter was used.  Emesis   This is a recurrent problem. The current episode started more than 2 days ago. The problem occurs continuously. The problem has not changed since onset.The emesis has an appearance of stomach contents. There has been no fever. The fever has been present for less than 1 day. Associated symptoms include abdominal pain and chills. Pertinent negatives include no cough, no diarrhea, no fever, no headaches, no myalgias, no sweats and no URI.    Past Medical History:  Diagnosis Date  . Headache   . Heart murmur     There are no active problems to display for this patient.   History reviewed. No pertinent surgical history.  OB History    Gravida Para Term Preterm AB Living   1             SAB TAB Ectopic Multiple Live Births                   Home Medications    Prior to Admission medications   Medication Sig Start Date End Date Taking? Authorizing Provider  ondansetron (ZOFRAN ODT) 4 MG disintegrating tablet Take 1 tablet (4 mg total) by mouth every 8 (eight) hours as needed for nausea or vomiting. 02/10/17   Audry Pili, PA-C    Family History History reviewed. No pertinent family history.  Social History Social History  Substance Use Topics  . Smoking status: Former Games developer  . Smokeless tobacco: Never Used  . Alcohol use No     Allergies   Patient has no known allergies.   Review of Systems Review of Systems  Constitutional: Positive for chills and fatigue. Negative for diaphoresis and fever.  HENT: Negative for congestion.   Respiratory: Negative for cough, chest tightness, shortness of breath, wheezing and stridor.   Cardiovascular:  Negative for chest pain, palpitations and leg swelling.  Gastrointestinal: Positive for abdominal pain and vomiting. Negative for abdominal distention, blood in stool, constipation and diarrhea.  Genitourinary: Negative for decreased urine volume, dysuria, flank pain, frequency, vaginal bleeding, vaginal discharge and vaginal pain.  Musculoskeletal: Negative for back pain, myalgias and neck pain.  Skin: Negative for rash and wound.  Neurological: Negative for headaches.  Psychiatric/Behavioral: Negative for agitation.  All other systems reviewed and are negative.    Physical Exam Updated Vital Signs BP 138/83 (BP Location: Left Arm)   Pulse 61   Temp 99.5 F (37.5 C) (Oral)   Resp 16   LMP 10/31/2016 (Approximate)   SpO2 100%   Physical Exam  Constitutional: She appears well-developed and well-nourished. No distress.  HENT:  Head: Normocephalic and atraumatic.  Mouth/Throat: Oropharynx is clear and moist.  Eyes: Conjunctivae and EOM are normal. Pupils are equal, round, and reactive to light.  Neck: Normal range of motion. Neck supple.  Cardiovascular: Normal rate, regular rhythm and intact distal pulses.   No murmur heard. Pulmonary/Chest: Effort normal and breath sounds normal. No stridor. No respiratory distress. She has no wheezes. She has no rales. She exhibits no tenderness.  Abdominal: Soft. Normal appearance. She exhibits no distension. There is tenderness in the epigastric area and left upper quadrant. There  is no rigidity, no rebound, no guarding and no CVA tenderness.    Musculoskeletal: She exhibits no edema or tenderness.  Neurological: She is alert. No sensory deficit.  Skin: Skin is warm and dry. Capillary refill takes less than 2 seconds. No rash noted. She is not diaphoretic.  Psychiatric: She has a normal mood and affect.  Nursing note and vitals reviewed.    ED Treatments / Results  Labs (all labs ordered are listed, but only abnormal results are  displayed) Labs Reviewed  CBC WITH DIFFERENTIAL/PLATELET  COMPREHENSIVE METABOLIC PANEL  LIPASE, BLOOD  PREGNANCY, URINE  URINALYSIS, ROUTINE W REFLEX MICROSCOPIC    EKG  EKG Interpretation None       Radiology No results found.  Procedures Procedures (including critical care time)  Medications Ordered in ED Medications  ondansetron (ZOFRAN) injection 4 mg (not administered)  sodium chloride 0.9 % bolus 1,000 mL (not administered)  sodium chloride 0.9 % bolus 1,000 mL (not administered)     Initial Impression / Assessment and Plan / ED Course  I have reviewed the triage vital signs and the nursing notes.  Pertinent labs & imaging results that were available during my care of the patient were reviewed by me and considered in my medical decision making (see chart for details).     Julia Goodwin is a 21 y.o. female with a past medical history significant for headaches who presents with nausea, vomiting, abdominal aching, and chills. Patient says that she has felt similar in the past and has been having symptoms for the last 5 days. She says that she was seen several days ago this facility and felt better after fluids and nausea medicine. She said that she was unable to afford the nausea prescription at discharge. She says that her nausea and vomiting seem to be related to drinking alcohol and she reports that she drank several shots of liquor last week on Wednesday right before this started. She says that she is having a abdominal aching in the left upper quadrant associated with her vomiting spells. She says he is had continuous vomiting for the last 5 days and has not tolerated any oral food or fluids. She is reports feeling dehydrated with her mouth dry. She denies fevers but does report some mild chills. She denies any change in urination or bowel movements. She denies any chest pain, shortness of breath, or cough. She denies other complaints.  History and exam are seen  above. On exam, patient is mild tenderness in the left upper quadrant. No lower abdominal tenderness. No focal neurologic deficits. Patient's mouth is dry. Lungs are clear. No CVA tenderness.  Patient will be given fluids and nausea medicine. Patient also have labs to look for electrolyte abnormalities or pancreatitis or kidney dysfunction. Anticipate reassessment following fluids and nausea medicine.    Diagnostic testing shows evidence of UTI. Patient also has hypokalemia. Patient did not want to take potassium supplementation at this time, instead she wants to supplement it with a diet at home. This was felt to be reasonable. No evidence of pancreatitis despite report that alcohol seems to prompt her symptoms. Alcoholic gastritis considered. Suspect nausea and vomiting also related to UTI.  Given significant improvement in symptoms, patient will be given prescription for nausea medication  That she will be able to afford. Patient also given dose of fosfomycin in the ED to treat her UTI. Patient tolerated antibiotic will without complications.  Patient instructed to follow up with PCP for further management.  Return precautions were given and understood. Patient discharged in good condition with improvement in symptoms.     Final Clinical Impressions(s) / ED Diagnoses   Final diagnoses:  Non-intractable vomiting with nausea, unspecified vomiting type  Acute cystitis without hematuria    New Prescriptions Discharge Medication List as of 02/13/2017  3:38 PM    START taking these medications   Details  ondansetron (ZOFRAN) 4 MG tablet Take 1 tablet (4 mg total) by mouth every 8 (eight) hours as needed for nausea or vomiting., Starting Mon 02/13/2017, Print        Clinical Impression: 1. Non-intractable vomiting with nausea, unspecified vomiting type   2. Acute cystitis without hematuria     Disposition: Discharge  Condition: Good  I have discussed the results, Dx and Tx plan with  the pt(& family if present). He/she/they expressed understanding and agree(s) with the plan. Discharge instructions discussed at great length. Strict return precautions discussed and pt &/or family have verbalized understanding of the instructions. No further questions at time of discharge.    Discharge Medication List as of 02/13/2017  3:38 PM    START taking these medications   Details  ondansetron (ZOFRAN) 4 MG tablet Take 1 tablet (4 mg total) by mouth every 8 (eight) hours as needed for nausea or vomiting., Starting Mon 02/13/2017, Print        Follow Up: [facility] [address] [place] [id number] [phone] Schedule an appointment as soon as possible for a visit    [facility] [address] [id number] mc [address] [id number] [phone]  If symptoms worsen     Solimar Maiden, Canary Brimhristopher J, MD 02/13/17 2155

## 2017-02-14 ENCOUNTER — Emergency Department (HOSPITAL_BASED_OUTPATIENT_CLINIC_OR_DEPARTMENT_OTHER): Payer: Self-pay

## 2017-02-14 ENCOUNTER — Inpatient Hospital Stay (HOSPITAL_COMMUNITY)
Admission: AD | Admit: 2017-02-14 | Discharge: 2017-02-14 | Discharge status: Against Medical Advice | Payer: Self-pay | Source: Ambulatory Visit | Attending: Family Medicine | Admitting: Family Medicine

## 2017-02-14 ENCOUNTER — Encounter (HOSPITAL_BASED_OUTPATIENT_CLINIC_OR_DEPARTMENT_OTHER): Payer: Self-pay

## 2017-02-14 ENCOUNTER — Emergency Department (HOSPITAL_BASED_OUTPATIENT_CLINIC_OR_DEPARTMENT_OTHER)
Admission: EM | Admit: 2017-02-14 | Discharge: 2017-02-15 | Disposition: A | Discharge status: Home / Self Care | Payer: Self-pay | Attending: Emergency Medicine | Admitting: Emergency Medicine

## 2017-02-14 DIAGNOSIS — R1030 Lower abdominal pain, unspecified: Secondary | ICD-10-CM

## 2017-02-14 DIAGNOSIS — Z87891 Personal history of nicotine dependence: Secondary | ICD-10-CM | POA: Insufficient documentation

## 2017-02-14 DIAGNOSIS — Z5321 Procedure and treatment not carried out due to patient leaving prior to being seen by health care provider: Secondary | ICD-10-CM | POA: Insufficient documentation

## 2017-02-14 DIAGNOSIS — K2900 Acute gastritis without bleeding: Secondary | ICD-10-CM | POA: Insufficient documentation

## 2017-02-14 LAB — CBC WITH DIFFERENTIAL/PLATELET
BASOS ABS: 0 10*3/uL (ref 0.0–0.1)
Basophils Relative: 0 %
EOS ABS: 0.1 10*3/uL (ref 0.0–0.7)
EOS PCT: 1 %
HCT: 39.4 % (ref 36.0–46.0)
HEMOGLOBIN: 13.7 g/dL (ref 12.0–15.0)
LYMPHS ABS: 2.7 10*3/uL (ref 0.7–4.0)
LYMPHS PCT: 27 %
MCH: 33.3 pg (ref 26.0–34.0)
MCHC: 34.8 g/dL (ref 30.0–36.0)
MCV: 95.9 fL (ref 78.0–100.0)
Monocytes Absolute: 0.8 10*3/uL (ref 0.1–1.0)
Monocytes Relative: 8 %
NEUTROS PCT: 64 %
Neutro Abs: 6.5 10*3/uL (ref 1.7–7.7)
PLATELETS: 166 10*3/uL (ref 150–400)
RBC: 4.11 MIL/uL (ref 3.87–5.11)
RDW: 11.8 % (ref 11.5–15.5)
WBC: 10.1 10*3/uL (ref 4.0–10.5)

## 2017-02-14 LAB — COMPREHENSIVE METABOLIC PANEL
ALT: 12 U/L — ABNORMAL LOW (ref 14–54)
AST: 18 U/L (ref 15–41)
Albumin: 4.7 g/dL (ref 3.5–5.0)
Alkaline Phosphatase: 52 U/L (ref 38–126)
Anion gap: 13 (ref 5–15)
BILIRUBIN TOTAL: 0.8 mg/dL (ref 0.3–1.2)
BUN: 7 mg/dL (ref 6–20)
CHLORIDE: 102 mmol/L (ref 101–111)
CO2: 23 mmol/L (ref 22–32)
Calcium: 9.5 mg/dL (ref 8.9–10.3)
Creatinine, Ser: 0.72 mg/dL (ref 0.44–1.00)
GFR calc Af Amer: >60 mL/min (ref >60)
Glucose, Bld: 82 mg/dL (ref 65–99)
POTASSIUM: 3 mmol/L — AB (ref 3.5–5.1)
Sodium: 138 mmol/L (ref 135–145)
TOTAL PROTEIN: 7.4 g/dL (ref 6.5–8.1)

## 2017-02-14 LAB — WET PREP, GENITAL
SPERM: NONE SEEN
Trich, Wet Prep: NONE SEEN
Yeast Wet Prep HPF POC: NONE SEEN

## 2017-02-14 LAB — URINALYSIS, ROUTINE W REFLEX MICROSCOPIC
Bilirubin Urine: NEGATIVE
Glucose, UA: NEGATIVE mg/dL
Nitrite: NEGATIVE
PROTEIN: NEGATIVE mg/dL
Specific Gravity, Urine: 1.021 (ref 1.005–1.030)
pH: 6.5 (ref 5.0–8.0)

## 2017-02-14 LAB — URINALYSIS, MICROSCOPIC (REFLEX)

## 2017-02-14 LAB — LIPASE, BLOOD: LIPASE: 20 U/L (ref 11–51)

## 2017-02-14 LAB — PREGNANCY, URINE: Preg Test, Ur: NEGATIVE

## 2017-02-14 MED ORDER — CAPSAICIN 0.025 % EX CREA
TOPICAL_CREAM | Freq: Once | CUTANEOUS | Status: AC
  Administered 2017-02-14: 1 via TOPICAL
  Filled 2017-02-14 (×2): qty 60

## 2017-02-14 MED ORDER — POTASSIUM CHLORIDE CRYS ER 20 MEQ PO TBCR
40.0000 meq | EXTENDED_RELEASE_TABLET | Freq: Once | ORAL | Status: DC
  Filled 2017-02-14: qty 2

## 2017-02-14 MED ORDER — PROMETHAZINE HCL 25 MG/ML IJ SOLN
25.0000 mg | Freq: Once | INTRAMUSCULAR | Status: AC
  Administered 2017-02-14: 25 mg via INTRAVENOUS
  Filled 2017-02-14: qty 1

## 2017-02-14 MED ORDER — SODIUM CHLORIDE 0.9 % IV BOLUS (SEPSIS)
1000.0000 mL | Freq: Once | INTRAVENOUS | Status: AC
  Administered 2017-02-14: 1000 mL via INTRAVENOUS

## 2017-02-14 MED ORDER — IOPAMIDOL (ISOVUE-300) INJECTION 61%
100.0000 mL | Freq: Once | INTRAVENOUS | Status: AC | PRN
  Administered 2017-02-14: 100 mL via INTRAVENOUS

## 2017-02-14 MED ORDER — KETOROLAC TROMETHAMINE 15 MG/ML IJ SOLN
15.0000 mg | Freq: Once | INTRAMUSCULAR | Status: AC
  Administered 2017-02-14: 15 mg via INTRAVENOUS
  Filled 2017-02-14: qty 1

## 2017-02-14 MED ORDER — ONDANSETRON HCL 4 MG/2ML IJ SOLN
4.0000 mg | Freq: Once | INTRAMUSCULAR | Status: AC
  Administered 2017-02-14: 4 mg via INTRAVENOUS
  Filled 2017-02-14: qty 2

## 2017-02-14 NOTE — ED Notes (Signed)
Pt states she is unable to urinate at this time.

## 2017-02-14 NOTE — ED Notes (Signed)
Pt reports that the nausea is better. Pt refuses to drink contrast for CT and continues to refuse K-Dur.

## 2017-02-14 NOTE — ED Notes (Signed)
Pt reports being treated for a UTI this past weekend and was given a Rx Zofran for nausea. Pt reports the Zofran was not helping and that she threw it up. Pt c/o abd soreness from throwing up so much.   Pt reports that marijuana temporarily relieves nausea and soreness, but then N/V returns.

## 2017-02-14 NOTE — MAU Note (Signed)
Pt states she has been to hospital twice since last Wednesday, was last seen yesterday @ [facility].  Dx'd with UTI, has been vomiting since last week, also lower abd pain since last week.  Denies bleeding, fever or diarrhea.  No dysuria.  Was prescribed zofran but is unable to hold it down.

## 2017-02-14 NOTE — ED Notes (Signed)
Pt's nausea continues

## 2017-02-14 NOTE — ED Provider Notes (Signed)
[facility]-EMERGENCY DEPT Provider Note   CSN: [id number] Arrival date & time: 02/14/17  1850  By signing my name below, I, Phillips ClimesFabiola de Louis, attest that this documentation has been prepared under the direction and in the presence of No att. providers found . Electronically Signed: Phillips ClimesFabiola de Louis, Scribe. 02/15/2017. 2:48 PM.   History   Chief Complaint Chief Complaint  Patient presents with  . Abdominal Pain    HPI Comments Julia Goodwin is a 21 y.o. female with no reported PMHx, who presents to the Emergency Department with complaints of suprapubic abdominal pain x1 wk.  Associated nausea and vomiting, which preceded abdominal pain.  One episode of diarrhea this morning.  She reports that she is unable to tolerate PO, but was able to take 2 Zofran yesterday.  No dysuria, vaginal d/c.  No fever.  She has been seen for similar sx yesterday (6/25) and x4 days ago (6/22.)  Pt had a procedural abortion x1 month ago and has not had a f/u appt.  She denies experiencing any other acute sx.  Pt is a smoker and admits to using marijuana.  Pt also drinks alcohol.   The history is provided by the patient and medical records. No language interpreter was used.    Past Medical History:  Diagnosis Date  . Headache   . Heart murmur     There are no active problems to display for this patient.   History reviewed. No pertinent surgical history.  OB History    Gravida Para Term Preterm AB Living   1             SAB TAB Ectopic Multiple Live Births                   Home Medications    Prior to Admission medications   Medication Sig Start Date End Date Taking? Authorizing Provider  ondansetron (ZOFRAN ODT) 4 MG disintegrating tablet Take 1 tablet (4 mg total) by mouth every 8 (eight) hours as needed for nausea or vomiting. 02/10/17   Audry PiliMohr, Tyler, PA-C  ondansetron (ZOFRAN) 4 MG tablet Take 1 tablet (4 mg total) by mouth every 8 (eight) hours as needed for nausea or vomiting. 02/13/17    Tegeler, Canary Brimhristopher J, MD  promethazine (PHENERGAN) 25 MG tablet Take 1 tablet (25 mg total) by mouth every 6 (six) hours as needed for nausea or vomiting. 02/15/17   Tilden Fossaees, Deondra Labrador, MD    Family History No family history on file.  Social History Social History  Substance Use Topics  . Smoking status: Former Smoker    Types: Cigarettes  . Smokeless tobacco: Never Used  . Alcohol use No     Allergies   Patient has no known allergies.   Review of Systems Review of Systems  Constitutional: Negative for chills and fever.  Respiratory: Negative for shortness of breath.   Cardiovascular: Negative for chest pain and leg swelling.  Gastrointestinal: Positive for abdominal pain, diarrhea, nausea and vomiting.  Genitourinary: Negative for difficulty urinating, dysuria, vaginal discharge and vaginal pain.  Musculoskeletal: Negative for back pain.  Neurological: Negative for weakness, numbness and headaches.  All other systems reviewed and are negative.  Physical Exam Updated Vital Signs BP 129/77 (BP Location: Left Arm)   Pulse 73   Temp 98.1 F (36.7 C) (Oral)   Resp 16   Ht 5\' 1"  (1.549 m)   Wt 42 kg (92 lb 8 oz)   LMP 01/28/2017   SpO2 100%  BMI 17.48 kg/m   Physical Exam  Constitutional: She is oriented to person, place, and time. She appears well-developed and well-nourished.  HENT:  Head: Normocephalic and atraumatic.  Cardiovascular: Normal rate and regular rhythm.   No murmur heard. Pulmonary/Chest: Effort normal and breath sounds normal. No respiratory distress.  Abdominal: Soft. There is no rebound and no guarding.  Moderate lower abdominal tenderness with no rebound or guarding.  Genitourinary:  Genitourinary Comments: Small amount of vaginal discharge, no CMT or adnexal tenderness  Musculoskeletal: She exhibits no edema or tenderness.  Neurological: She is alert and oriented to person, place, and time.  Skin: Skin is warm and dry.  Psychiatric: She has a  normal mood and affect. Her behavior is normal.  Nursing note and vitals reviewed.   ED Treatments / Results  DIAGNOSTIC STUDIES: Oxygen Saturation is 100% on room air, normal by my interpretation.    COORDINATION OF CARE: 7:56 PM Discussed treatment plan with pt at bedside and pt agreed to plan.  Labs (all labs ordered are listed, but only abnormal results are displayed) Labs Reviewed  WET PREP, GENITAL - Abnormal; Notable for the following:       Result Value   Clue Cells Wet Prep HPF POC PRESENT (*)    WBC, Wet Prep HPF POC MANY (*)    All other components within normal limits  COMPREHENSIVE METABOLIC PANEL - Abnormal; Notable for the following:    Potassium 3.0 (*)    ALT 12 (*)    All other components within normal limits  URINALYSIS, ROUTINE W REFLEX MICROSCOPIC - Abnormal; Notable for the following:    APPearance CLOUDY (*)    Hgb urine dipstick SMALL (*)    Ketones, ur >80 (*)    Leukocytes, UA LARGE (*)    All other components within normal limits  URINALYSIS, MICROSCOPIC (REFLEX) - Abnormal; Notable for the following:    Bacteria, UA MANY (*)    Squamous Epithelial / LPF TOO NUMEROUS TO COUNT (*)    All other components within normal limits  CBC WITH DIFFERENTIAL/PLATELET  LIPASE, BLOOD  PREGNANCY, URINE  GC/CHLAMYDIA PROBE AMP ([place]) NOT AT [facility]    EKG  EKG Interpretation None       Radiology Ct Abdomen Pelvis W Contrast  Result Date: 02/14/2017 CLINICAL DATA:  Suprapubic abdominal pain for 1 week. Nausea and vomiting pre seated abdominal pain. Diarrhea today. EXAM: CT ABDOMEN AND PELVIS WITH CONTRAST TECHNIQUE: Multidetector CT imaging of the abdomen and pelvis was performed using the standard protocol following bolus administration of intravenous contrast. CONTRAST:  ISOVUE-300 IOPAMIDOL (ISOVUE-300) INJECTION 61% COMPARISON:  05/15/2015 FINDINGS: Lower chest: No acute abnormality. Hepatobiliary: No focal liver abnormality is seen. No  gallstones, gallbladder wall thickening, or biliary dilatation. Pancreas: Unremarkable. No pancreatic ductal dilatation or surrounding inflammatory changes. Spleen: Normal in size without focal abnormality. Adrenals/Urinary Tract: Adrenal glands are unremarkable. Kidneys are normal, without renal calculi, focal lesion, or hydronephrosis. Bladder is unremarkable. Stomach/Bowel: Stomach is within normal limits. Appendix is normal. No evidence of bowel wall thickening, distention, or inflammatory changes. Vascular/Lymphatic: No significant vascular findings are present. No enlarged abdominal or pelvic lymph nodes. Reproductive: Uterus and bilateral adnexa are unremarkable. Other: No focal inflammation.  No ascites. Musculoskeletal: No significant skeletal lesion. IMPRESSION: No significant abnormality. Electronically Signed   By: Ellery Plunk M.D.   On: 02/14/2017 23:52    Procedures Procedures (including critical care time)  Medications Ordered in ED Medications  sodium chloride 0.9 %  bolus 1,000 mL (0 mLs Intravenous Stopped 02/14/17 2124)  ondansetron (ZOFRAN) injection 4 mg (4 mg Intravenous Given 02/14/17 2018)  promethazine (PHENERGAN) injection 25 mg (25 mg Intravenous Given 02/14/17 2159)  ketorolac (TORADOL) 15 MG/ML injection 15 mg (15 mg Intravenous Given 02/14/17 2202)  capsaicin (ZOSTRIX) 0.025 % cream (1 application Topical Given 02/14/17 2204)  iopamidol (ISOVUE-300) 61 % injection 100 mL (100 mLs Intravenous Contrast Given 02/14/17 2315)     Initial Impression / Assessment and Plan / ED Course  I have reviewed the triage vital signs and the nursing notes.  Pertinent labs & imaging results that were available during my care of the patient were reviewed by me and considered in my medical decision making (see chart for details).     Pt here with recurrent vomiting/diarrhea, abdominal pain.  Pelvic examination with some d/c but no findings concerning for PID/endometritis.  Pt with  mild hypokalemia.  CT negative for appendicitis, obstruction. Pt without vomiting in ED.  Discussed liquid diet, outpatient follow up, return precautions.    UA with leuks, bacteria, multiple epis - pt without dysuria, UA c/w contaminated specimen, will not treat in this setting for UTI.    I personally performed the services described in this documentation, which was scribed in my presence. The recorded information has been reviewed and is accurate.  Final Clinical Impressions(s) / ED Diagnoses   Final diagnoses:  Lower abdominal pain  Acute gastritis without hemorrhage, unspecified gastritis type    New Prescriptions Discharge Medication List as of 02/15/2017 12:03 AM    START taking these medications   Details  promethazine (PHENERGAN) 25 MG tablet Take 1 tablet (25 mg total) by mouth every 6 (six) hours as needed for nausea or vomiting., Starting Wed 02/15/2017, Print         Tilden Fossa, MD 02/15/17 1451

## 2017-02-14 NOTE — ED Notes (Signed)
Patient transported to CT 

## 2017-02-14 NOTE — MAU Note (Signed)
RN from [facility] called to state pt is at their facility for evaluation. Pt did not tell anyone that she was leaving.

## 2017-02-14 NOTE — ED Triage Notes (Signed)
Pt c/o abd pain n/v/d x 7 days-has been seen here x 2 and [facility] today-NAD-presents to triage in w/c

## 2017-02-15 LAB — GC/CHLAMYDIA PROBE AMP (~~LOC~~) NOT AT ARMC
Chlamydia: NEGATIVE
NEISSERIA GONORRHEA: POSITIVE — AB

## 2017-02-15 MED ORDER — PROMETHAZINE HCL 25 MG PO TABS: 25.0000 mg | ORAL_TABLET | Freq: Four times a day (QID) | ORAL | 0 refills | Status: DC | PRN

## 2017-02-15 NOTE — ED Notes (Signed)
Pt verbalizes understanding of d/c instructions and denies any further needs at this time. 

## 2017-02-21 ENCOUNTER — Encounter (HOSPITAL_BASED_OUTPATIENT_CLINIC_OR_DEPARTMENT_OTHER): Payer: Self-pay | Admitting: *Deleted

## 2017-02-21 ENCOUNTER — Emergency Department (HOSPITAL_BASED_OUTPATIENT_CLINIC_OR_DEPARTMENT_OTHER)
Admission: EM | Admit: 2017-02-21 | Discharge: 2017-02-21 | Disposition: A | Discharge status: Home / Self Care | Payer: Self-pay | Attending: Emergency Medicine | Admitting: Emergency Medicine

## 2017-02-21 DIAGNOSIS — A549 Gonococcal infection, unspecified: Secondary | ICD-10-CM | POA: Insufficient documentation

## 2017-02-21 DIAGNOSIS — Z87891 Personal history of nicotine dependence: Secondary | ICD-10-CM | POA: Insufficient documentation

## 2017-02-21 DIAGNOSIS — N760 Acute vaginitis: Secondary | ICD-10-CM | POA: Insufficient documentation

## 2017-02-21 DIAGNOSIS — B9689 Other specified bacterial agents as the cause of diseases classified elsewhere: Secondary | ICD-10-CM

## 2017-02-21 MED ORDER — CEFTRIAXONE SODIUM 250 MG IJ SOLR
250.0000 mg | Freq: Once | INTRAMUSCULAR | Status: AC
  Administered 2017-02-21: 250 mg via INTRAMUSCULAR
  Filled 2017-02-21: qty 250

## 2017-02-21 MED ORDER — AZITHROMYCIN 250 MG PO TABS
1000.0000 mg | ORAL_TABLET | Freq: Once | ORAL | Status: AC
  Administered 2017-02-21: 1000 mg via ORAL
  Filled 2017-02-21: qty 4

## 2017-02-21 MED ORDER — LIDOCAINE HCL (PF) 1 % IJ SOLN
INTRAMUSCULAR | Status: AC
  Administered 2017-02-21: 5 mL
  Filled 2017-02-21: qty 5

## 2017-02-21 MED ORDER — METRONIDAZOLE 500 MG PO TABS
500.0000 mg | ORAL_TABLET | Freq: Two times a day (BID) | ORAL | 0 refills | Status: DC
Start: 2017-02-21 — End: 2018-03-09

## 2017-02-21 NOTE — ED Triage Notes (Signed)
Pt reports she was seen here last week. She was called and told to come in for positive STD screening for gonorrhea. Pt reports vaginal odor and clear discharge for over a week.

## 2017-02-21 NOTE — ED Provider Notes (Signed)
[facility] Provider Note: Lowella DellJ. Lane Abbegail Matuska, MD, FACEP  CSN: [id number] MRN: [id number] ARRIVAL: 02/21/17 at 0122 ROOM: [facility]   CHIEF COMPLAINT  STD treatment   HISTORY OF PRESENT ILLNESS  Julia Goodwin is a 21 y.o. female who was seen on June 26 and had a pelvic exam as part of a workup for abdominal pain. GC chlamydia probe tested positive for gonorrhea and she was told to return to the ED. Her wet prep also showed evidence of bacterial vaginosis and she continues to have a vaginal discharge and abnormal vaginal odor. She denies abdominal pain, nausea, vomiting, dysuria or vaginal bleeding. She still has a decreased appetite.   Past Medical History:  Diagnosis Date  . Headache   . Heart murmur     History reviewed. No pertinent surgical history.  No family history on file.  Social History  Substance Use Topics  . Smoking status: Former Smoker    Types: Cigarettes  . Smokeless tobacco: Never Used  . Alcohol use No    Prior to Admission medications   Medication Sig Start Date End Date Taking? Authorizing Provider  ondansetron (ZOFRAN ODT) 4 MG disintegrating tablet Take 1 tablet (4 mg total) by mouth every 8 (eight) hours as needed for nausea or vomiting. 02/10/17   Audry PiliMohr, Tyler, PA-C  ondansetron (ZOFRAN) 4 MG tablet Take 1 tablet (4 mg total) by mouth every 8 (eight) hours as needed for nausea or vomiting. 02/13/17   Tegeler, Canary Brimhristopher J, MD  promethazine (PHENERGAN) 25 MG tablet Take 1 tablet (25 mg total) by mouth every 6 (six) hours as needed for nausea or vomiting. 02/15/17   Tilden Fossaees, Elizabeth, MD    Allergies Patient has no known allergies.   REVIEW OF SYSTEMS  Negative except as noted here or in the History of Present Illness.   PHYSICAL EXAMINATION  Initial Vital Signs Blood pressure 124/83, pulse 77, temperature 98.5 F (36.9 C), temperature source Oral, resp. rate 16, last menstrual period 01/28/2017, SpO2 100 %, unknown if currently  breastfeeding.  Examination General: Well-developed, well-nourished female in no acute distress; appearance consistent with age of record HENT: normocephalic; atraumatic Eyes: pupils equal, round and reactive to light; extraocular muscles intact Neck: supple Heart: regular rate and rhythm Lungs: clear to auscultation bilaterally Abdomen: soft; nondistended; nontender; no masses or hepatosplenomegaly; bowel sounds present Extremities: No deformity; full range of motion; pulses normal Neurologic: Awake, alert and oriented; motor function intact in all extremities and symmetric; no facial droop Skin: Warm and dry Psychiatric: Normal mood and affect   RESULTS  Summary of this visit's results, reviewed by myself:   EKG Interpretation  Date/Time:    Ventricular Rate:    PR Interval:    QRS Duration:   QT Interval:    QTC Calculation:   R Axis:     Text Interpretation:        Laboratory Studies: No results found for this or any previous visit (from the past 24 hour(s)). Imaging Studies: No results found.  ED COURSE  Nursing notes and initial vitals signs, including pulse oximetry, reviewed.  Vitals:   02/21/17 0127  BP: 124/83  Pulse: 77  Resp: 16  Temp: 98.5 F (36.9 C)  TempSrc: Oral  SpO2: 100%    PROCEDURES    ED DIAGNOSES     ICD-10-CM   1. Gonorrhea A54.9   2. BV (bacterial vaginosis) N76.0    B96.89        Nikelle Malatesta, MD 02/21/17  0317  

## 2017-06-08 ENCOUNTER — Encounter (HOSPITAL_BASED_OUTPATIENT_CLINIC_OR_DEPARTMENT_OTHER): Payer: Self-pay | Admitting: Emergency Medicine

## 2017-06-08 ENCOUNTER — Emergency Department (HOSPITAL_BASED_OUTPATIENT_CLINIC_OR_DEPARTMENT_OTHER)
Admission: EM | Admit: 2017-06-08 | Discharge: 2017-06-08 | Disposition: A | Discharge status: Home / Self Care | Payer: Self-pay | Attending: Emergency Medicine | Admitting: Emergency Medicine

## 2017-06-08 DIAGNOSIS — Z87891 Personal history of nicotine dependence: Secondary | ICD-10-CM | POA: Insufficient documentation

## 2017-06-08 DIAGNOSIS — N72 Inflammatory disease of cervix uteri: Secondary | ICD-10-CM | POA: Insufficient documentation

## 2017-06-08 LAB — WET PREP, GENITAL
Sperm: NONE SEEN
TRICH WET PREP: NONE SEEN
YEAST WET PREP: NONE SEEN

## 2017-06-08 LAB — PREGNANCY, URINE: PREG TEST UR: NEGATIVE

## 2017-06-08 MED ORDER — DOXYCYCLINE HYCLATE 100 MG PO CAPS: 100.0000 mg | ORAL_CAPSULE | Freq: Two times a day (BID) | ORAL | 0 refills | Status: DC

## 2017-06-08 MED ORDER — CEFTRIAXONE SODIUM 250 MG IJ SOLR
250.0000 mg | Freq: Once | INTRAMUSCULAR | Status: AC
  Administered 2017-06-08: 250 mg via INTRAMUSCULAR
  Filled 2017-06-08: qty 250

## 2017-06-08 MED ORDER — AZITHROMYCIN 1 G PO PACK
1.0000 g | PACK | Freq: Once | ORAL | Status: AC
  Administered 2017-06-08: 1 g via ORAL
  Filled 2017-06-08: qty 1

## 2017-06-08 NOTE — ED Triage Notes (Addendum)
Pt reports vaginal discharge. Pt was seen in July and treated for STD but has not followed up with GYN.

## 2017-06-08 NOTE — ED Provider Notes (Signed)
[facility] EMERGENCY DEPARTMENT Provider Note   CSN: [id number] Arrival date & time: 06/08/17  0014     History   Chief Complaint Chief Complaint  Patient presents with  . Vaginal Discharge    HPI Julia Goodwin is a 21 y.o. female.  The history is provided by the patient.  Vaginal Discharge   This is a recurrent problem. The current episode started more than 1 week ago. The problem occurs constantly. The problem has not changed since onset.The discharge occurs spontaneously. The discharge was watery and yellow. She is not pregnant. She has not missed her period. Pertinent negatives include no fever, no dysuria, no frequency and no genital lesions. She has tried nothing for the symptoms. The treatment provided no relief.  Had gonorrhea over the summer and has had unprotected encounters.  Has not followed up with GYN because it takes 2 days to get an appointment.    Past Medical History:  Diagnosis Date  . Headache   . Heart murmur     There are no active problems to display for this patient.   History reviewed. No pertinent surgical history.  OB History    Gravida Para Term Preterm AB Living   1             SAB TAB Ectopic Multiple Live Births                   Home Medications    Prior to Admission medications   Medication Sig Start Date End Date Taking? Authorizing Provider  metroNIDAZOLE (FLAGYL) 500 MG tablet Take 1 tablet (500 mg total) by mouth 2 (two) times daily. One po bid x 7 days 02/21/17   Molpus, Jonny RuizJohn, MD  ondansetron (ZOFRAN ODT) 4 MG disintegrating tablet Take 1 tablet (4 mg total) by mouth every 8 (eight) hours as needed for nausea or vomiting. 02/10/17   Audry PiliMohr, Tyler, PA-C  ondansetron (ZOFRAN) 4 MG tablet Take 1 tablet (4 mg total) by mouth every 8 (eight) hours as needed for nausea or vomiting. 02/13/17   Tegeler, Canary Brimhristopher J, MD  promethazine (PHENERGAN) 25 MG tablet Take 1 tablet (25 mg total) by mouth every 6 (six) hours as needed  for nausea or vomiting. 02/15/17   Tilden Fossaees, Elizabeth, MD    Family History No family history on file.  Social History Social History  Substance Use Topics  . Smoking status: Former Smoker    Types: Cigarettes  . Smokeless tobacco: Never Used  . Alcohol use No     Allergies   Patient has no known allergies.   Review of Systems Review of Systems  Constitutional: Negative for fever.  Respiratory: Negative for shortness of breath.   Cardiovascular: Negative for chest pain.  Genitourinary: Positive for vaginal discharge. Negative for dysuria, frequency and genital sores.  All other systems reviewed and are negative.    Physical Exam Updated Vital Signs BP 120/72 (BP Location: Right Arm)   Pulse 75   Temp 98.6 F (37 C) (Oral)   Resp 18   SpO2 100%   Physical Exam  Constitutional: She is oriented to person, place, and time. She appears well-developed and well-nourished. No distress.  HENT:  Head: Normocephalic and atraumatic.  Mouth/Throat: No oropharyngeal exudate.  Eyes: Pupils are equal, round, and reactive to light. Conjunctivae are normal.  Neck: Normal range of motion. Neck supple.  Cardiovascular: Normal rate, regular rhythm, normal heart sounds and intact distal pulses.   Pulmonary/Chest: Effort normal and  breath sounds normal. No respiratory distress. She has no wheezes.  Abdominal: Soft. Bowel sounds are normal. She exhibits no mass. There is no tenderness. There is no rebound and no guarding.  Genitourinary: Vaginal discharge found.  Genitourinary Comments: chaperone present no cmt scant yellow discharge  Musculoskeletal: Normal range of motion.  Neurological: She is alert and oriented to person, place, and time. She displays normal reflexes.  Skin: Skin is warm and dry. Capillary refill takes less than 2 seconds.  Psychiatric: She has a normal mood and affect.     ED Treatments / Results   Vitals:   06/08/17 0019  BP: 120/72  Pulse: 75  Resp: 18    Temp: 98.6 F (37 C)  SpO2: 100%    Labs (all labs ordered are listed, but only abnormal results are displayed)  Results for orders placed or performed during the hospital encounter of 06/08/17  Wet prep, genital  Result Value Ref Range   Yeast Wet Prep HPF POC NONE SEEN NONE SEEN   Trich, Wet Prep NONE SEEN NONE SEEN   Clue Cells Wet Prep HPF POC PRESENT (A) NONE SEEN   WBC, Wet Prep HPF POC MANY (A) NONE SEEN   Sperm NONE SEEN   Pregnancy, urine  Result Value Ref Range   Preg Test, Ur NEGATIVE NEGATIVE   No results found.  Procedures Procedures (including critical care time)  Medications Ordered in ED Medications  cefTRIAXone (ROCEPHIN) injection 250 mg (not administered)  azithromycin (ZITHROMAX) powder 1 g (not administered)      Final Clinical Impressions(s) / ED Diagnoses   I suspect partner was not treated.  No sexual activity for 2 weeks until confirmed partner has been treated.  You must follow up with GYN to ensure treated.  Will start 2 weeks or doxy in addition.    Strict return precautions given for  Shortness of breath, swelling or the lips or tongue, chest pain, dyspnea on exertion, new weakness or numbness changes in vision or speech,  Inability to tolerate liquids or food, changes in voice cough, altered mental status or any concerns. No signs of systemic illness or infection. The patient is nontoxic-appearing on exam and vital signs are within normal limits.    I have reviewed the triage vital signs and the nursing notes. Pertinent labs &imaging results that were available during my care of the patient were reviewed by me and considered in my medical decision making (see chart for details).  After history, exam, and medical workup I feel the patient has been appropriately medically screened and is safe for discharge home. Pertinent diagnoses were discussed with the patient. Patient was given return precautions.      Roe Koffman, MD 06/08/17  1610

## 2017-06-08 NOTE — Discharge Instructions (Signed)
No sexual activity of any kind, until 7 days after all partners are treated.

## 2017-06-09 LAB — GC/CHLAMYDIA PROBE AMP (~~LOC~~) NOT AT ARMC
Chlamydia: NEGATIVE
Neisseria Gonorrhea: NEGATIVE

## 2017-11-25 ENCOUNTER — Other Ambulatory Visit: Payer: Self-pay

## 2017-11-25 ENCOUNTER — Encounter (HOSPITAL_BASED_OUTPATIENT_CLINIC_OR_DEPARTMENT_OTHER): Payer: Self-pay | Admitting: Emergency Medicine

## 2017-11-25 ENCOUNTER — Emergency Department (HOSPITAL_BASED_OUTPATIENT_CLINIC_OR_DEPARTMENT_OTHER)
Admission: EM | Admit: 2017-11-25 | Discharge: 2017-11-25 | Disposition: A | Discharge status: Home / Self Care | Payer: Self-pay | Attending: Emergency Medicine | Admitting: Emergency Medicine

## 2017-11-25 DIAGNOSIS — B9689 Other specified bacterial agents as the cause of diseases classified elsewhere: Secondary | ICD-10-CM | POA: Insufficient documentation

## 2017-11-25 DIAGNOSIS — Z87891 Personal history of nicotine dependence: Secondary | ICD-10-CM | POA: Insufficient documentation

## 2017-11-25 DIAGNOSIS — N898 Other specified noninflammatory disorders of vagina: Secondary | ICD-10-CM

## 2017-11-25 DIAGNOSIS — Z79899 Other long term (current) drug therapy: Secondary | ICD-10-CM | POA: Insufficient documentation

## 2017-11-25 DIAGNOSIS — N760 Acute vaginitis: Secondary | ICD-10-CM | POA: Insufficient documentation

## 2017-11-25 LAB — URINALYSIS, ROUTINE W REFLEX MICROSCOPIC
BILIRUBIN URINE: NEGATIVE
Glucose, UA: NEGATIVE mg/dL
KETONES UR: NEGATIVE mg/dL
Nitrite: NEGATIVE
PROTEIN: NEGATIVE mg/dL
Specific Gravity, Urine: 1.02 (ref 1.005–1.030)
pH: 6 (ref 5.0–8.0)

## 2017-11-25 LAB — URINALYSIS, MICROSCOPIC (REFLEX)

## 2017-11-25 LAB — WET PREP, GENITAL
Sperm: NONE SEEN
Trich, Wet Prep: NONE SEEN
Yeast Wet Prep HPF POC: NONE SEEN

## 2017-11-25 LAB — PREGNANCY, URINE: PREG TEST UR: NEGATIVE

## 2017-11-25 MED ORDER — METRONIDAZOLE 500 MG PO TABS: 500.0000 mg | ORAL_TABLET | Freq: Two times a day (BID) | ORAL | 0 refills | Status: DC

## 2017-11-25 NOTE — Discharge Instructions (Signed)
Your workup today showed evidence of bacterial vaginosis.  Given your history of bacterial infection in the discharge with some irritation on her cervix, will treat you for possible infection.  Please follow-up with your OB/GYN and PCP for further management of the results.  If any symptoms change or worsen, please return to the nearest emergency department.

## 2017-11-25 NOTE — ED Provider Notes (Signed)
[facility] EMERGENCY DEPARTMENT Provider Note   CSN: [id number] Arrival date & time: 11/25/17  4098     History   Chief Complaint Chief Complaint  Patient presents with  . Vaginal Itching    HPI [name] is a 22 y.o. female.  The history is provided by the patient and medical records. No language interpreter was used.  Vaginal Discharge   This is a recurrent problem. The current episode started more than 2 days ago. The problem occurs constantly. The discharge was white and malodorous. She is not pregnant. She has not missed her period. Associated symptoms include genital itching. Pertinent negatives include no diaphoresis, no fever, no abdominal swelling, no abdominal pain, no constipation, no diarrhea, no nausea, no vomiting, no dysuria, no frequency, no genital burning, no genital lesions and no perineal pain. She has tried nothing for the symptoms.    Past Medical History:  Diagnosis Date  . Headache   . Heart murmur     There are no active problems to display for this patient.   History reviewed. No pertinent surgical history.   OB History    Gravida  1   Para      Term      Preterm      AB      Living        SAB      TAB      Ectopic      Multiple      Live Births               Home Medications    Prior to Admission medications   Medication Sig Start Date End Date Taking? Authorizing Provider  doxycycline (VIBRAMYCIN) 100 MG capsule Take 1 capsule (100 mg total) by mouth 2 (two) times daily. 06/08/17   Palumbo, April, MD  metroNIDAZOLE (FLAGYL) 500 MG tablet Take 1 tablet (500 mg total) by mouth 2 (two) times daily. One po bid x 7 days 02/21/17   Molpus, Jonny Ruiz, MD  ondansetron (ZOFRAN ODT) 4 MG disintegrating tablet Take 1 tablet (4 mg total) by mouth every 8 (eight) hours as needed for nausea or vomiting. 02/10/17   Audry Pili, PA-C  ondansetron (ZOFRAN) 4 MG tablet Take 1 tablet (4 mg total) by mouth every 8 (eight)  hours as needed for nausea or vomiting. 02/13/17   Tegeler, Canary Brim, MD  promethazine (PHENERGAN) 25 MG tablet Take 1 tablet (25 mg total) by mouth every 6 (six) hours as needed for nausea or vomiting. 02/15/17   Tilden Fossa, MD    Family History History reviewed. No pertinent family history.  Social History Social History   Tobacco Use  . Smoking status: Former Smoker    Types: Cigarettes  . Smokeless tobacco: Never Used  Substance Use Topics  . Alcohol use: No  . Drug use: No     Allergies   Patient has no known allergies.   Review of Systems Review of Systems  Constitutional: Negative for chills, diaphoresis, fatigue and fever.  HENT: Negative for congestion.   Eyes: Negative for visual disturbance.  Respiratory: Negative for cough, chest tightness, shortness of breath, wheezing and stridor.   Cardiovascular: Negative for chest pain.  Gastrointestinal: Negative for abdominal pain, constipation, diarrhea, nausea and vomiting.  Genitourinary: Positive for vaginal discharge. Negative for dysuria, flank pain, frequency, vaginal bleeding and vaginal pain.  Musculoskeletal: Negative for back pain, neck pain and neck stiffness.  Skin: Negative for rash and wound.  Neurological: Negative for light-headedness and headaches.  Psychiatric/Behavioral: Negative for agitation.  All other systems reviewed and are negative.    Physical Exam Updated Vital Signs BP 111/70 (BP Location: Left Arm)   Pulse 64   Temp 98.4 F (36.9 C) (Oral)   Resp 16   Ht 5\' 1"  (1.549 m)   Wt 52.2 kg (115 lb)   SpO2 100%   BMI 21.73 kg/m   Physical Exam  Constitutional: She appears well-developed and well-nourished. No distress.  HENT:  Head: Normocephalic.  Nose: Nose normal.  Mouth/Throat: Oropharynx is clear and moist. No oropharyngeal exudate.  Eyes: Pupils are equal, round, and reactive to light. Conjunctivae and EOM are normal.  Neck: Normal range of motion.  Cardiovascular:  Normal rate.  Pulmonary/Chest: Effort normal and breath sounds normal. She has no wheezes. She has no rales. She exhibits no tenderness.  Abdominal: Soft. Bowel sounds are normal. She exhibits no distension. There is no tenderness.  Genitourinary: There is no tenderness on the right labia. There is no tenderness on the left labia. Uterus is not tender. Cervix exhibits discharge. Cervix exhibits no motion tenderness. Right adnexum displays no tenderness and no fullness. Left adnexum displays no tenderness and no fullness. No tenderness or bleeding in the vagina. No foreign body in the vagina. Vaginal discharge found.  Musculoskeletal: She exhibits no tenderness.  Neurological: No sensory deficit. She exhibits normal muscle tone.  Skin: Skin is warm. No rash noted. She is not diaphoretic. No erythema.  Psychiatric: She has a normal mood and affect.  Nursing note and vitals reviewed.    ED Treatments / Results  Labs (all labs ordered are listed, but only abnormal results are displayed) Labs Reviewed  WET PREP, GENITAL - Abnormal; Notable for the following components:      Result Value   Clue Cells Wet Prep HPF POC PRESENT (*)    WBC, Wet Prep HPF POC MANY (*)    All other components within normal limits  URINALYSIS, ROUTINE W REFLEX MICROSCOPIC - Abnormal; Notable for the following components:   APPearance CLOUDY (*)    Hgb urine dipstick TRACE (*)    Leukocytes, UA SMALL (*)    All other components within normal limits  URINALYSIS, MICROSCOPIC (REFLEX) - Abnormal; Notable for the following components:   Bacteria, UA MANY (*)    Squamous Epithelial / LPF 6-30 (*)    All other components within normal limits  PREGNANCY, URINE  RPR  HIV ANTIBODY (ROUTINE TESTING)  GC/CHLAMYDIA PROBE AMP ([place]) NOT AT [facility]    EKG None  Radiology No results found.  Procedures Procedures (including critical care time)  Medications Ordered in ED Medications - No data to  display   Initial Impression / Assessment and Plan / ED Course  I have reviewed the triage vital signs and the nursing notes.  Pertinent labs & imaging results that were available during my care of the patient were reviewed by me and considered in my medical decision making (see chart for details).     Julia Goodwin is a 22 y.o. female with a past medical history significant for prior STI and cervicitis who presents with several days of vaginal discharge and itching.  Patient reports no pelvic pain.  She denies any urinary symptoms or GI symptoms.  She denies any recent trauma.  She reports no new partners and has not changed her methods of birth control.  She reports this feels like when she had bacterial infection in  the past requiring antibiotics.  On exam, patient's abdomen nontender.  Lungs clear.  On pelvic exam, patient was found to have a discharge that was white and milky.  Patient had no cervical tenderness or adnexal tenderness.  Patient did have some irritation on the cervix on inspection.  Wet prep and GC will be sent and screening STI blood work was also sent.  Next  Given the similar to prior STI with bacteria as well as the irritation on the cervix and discharge, patient will be treated with antibiotics.  Do not feel patient has PID or TOA.  Do not feel she needs ultrasound.  After wet prep has returned, anticipate discharge for the patient with OB/GYN follow-up.  Wet prep revealed evidence of bacterial vaginosis but no trichomoniasis or yeast seen.  Patient was not pregnant.  RPR and HIV will not return, patient will follow up with OB/GYN for this.  Given the similarity to prior STI with the discharge and the irritation on the cervix that was seen, patient will be treated for cervicitis.  Do not feel patient has PID or needs ultrasound at this time.  Patient given Rocephin and azithromycin in the emergency department.  Patient will be sent home with prescription for  Flagyl.  Patient will follow up with OB/GYN and PCP.  Patient no questions or concerns and was discharged in good condition.    Final Clinical Impressions(s) / ED Diagnoses   Final diagnoses:  BV (bacterial vaginosis)  Vaginal discharge    ED Discharge Orders        Ordered    metroNIDAZOLE (FLAGYL) 500 MG tablet  2 times daily     11/25/17 1123      Clinical Impression: 1. BV (bacterial vaginosis)   2. Vaginal discharge     Disposition: Discharge  Condition: Good  I have discussed the results, Dx and Tx plan with the pt(& family if present). He/she/they expressed understanding and agree(s) with the plan. Discharge instructions discussed at great length. Strict return precautions discussed and pt &/or family have verbalized understanding of the instructions. No further questions at time of discharge.    Discharge Medication List as of 11/25/2017 11:24 AM    START taking these medications   Details  !! metroNIDAZOLE (FLAGYL) 500 MG tablet Take 1 tablet (500 mg total) by mouth 2 (two) times daily., Starting Sat 11/25/2017, Print     !! - Potential duplicate medications found. Please discuss with provider.      Follow Up: [facility] [address] [place] [phone] Schedule an appointment as soon as possible for a visit    [facility] [address] [place] [id number] [phone] Schedule an appointment as soon as possible for a visit       Tegeler, Canary Brimhristopher J, MD 11/25/17 1209

## 2017-11-25 NOTE — ED Notes (Signed)
Pelvic Cart at bedside 

## 2017-11-25 NOTE — ED Triage Notes (Signed)
Patient states that she is having vaginal d/c and itching x 2 days

## 2017-11-26 LAB — HIV ANTIBODY (ROUTINE TESTING W REFLEX): HIV Screen 4th Generation wRfx: NONREACTIVE

## 2017-11-26 LAB — RPR: RPR: NONREACTIVE

## 2017-11-27 LAB — GC/CHLAMYDIA PROBE AMP (~~LOC~~) NOT AT ARMC
Chlamydia: NEGATIVE
Neisseria Gonorrhea: NEGATIVE

## 2018-01-12 ENCOUNTER — Encounter (HOSPITAL_BASED_OUTPATIENT_CLINIC_OR_DEPARTMENT_OTHER): Payer: Self-pay

## 2018-01-12 ENCOUNTER — Emergency Department (HOSPITAL_BASED_OUTPATIENT_CLINIC_OR_DEPARTMENT_OTHER)
Admission: EM | Admit: 2018-01-12 | Discharge: 2018-01-12 | Disposition: A | Discharge status: Home / Self Care | Payer: Self-pay | Attending: Emergency Medicine | Admitting: Emergency Medicine

## 2018-01-12 ENCOUNTER — Other Ambulatory Visit: Payer: Self-pay

## 2018-01-12 DIAGNOSIS — Z87891 Personal history of nicotine dependence: Secondary | ICD-10-CM | POA: Insufficient documentation

## 2018-01-12 DIAGNOSIS — N3000 Acute cystitis without hematuria: Secondary | ICD-10-CM

## 2018-01-12 DIAGNOSIS — Z3202 Encounter for pregnancy test, result negative: Secondary | ICD-10-CM | POA: Insufficient documentation

## 2018-01-12 DIAGNOSIS — Z79899 Other long term (current) drug therapy: Secondary | ICD-10-CM | POA: Insufficient documentation

## 2018-01-12 DIAGNOSIS — G43709 Chronic migraine without aura, not intractable, without status migrainosus: Secondary | ICD-10-CM | POA: Insufficient documentation

## 2018-01-12 DIAGNOSIS — N390 Urinary tract infection, site not specified: Secondary | ICD-10-CM | POA: Insufficient documentation

## 2018-01-12 LAB — COMPREHENSIVE METABOLIC PANEL
ALT: 12 U/L — ABNORMAL LOW (ref 14–54)
ANION GAP: 8 (ref 5–15)
AST: 21 U/L (ref 15–41)
Albumin: 4.8 g/dL (ref 3.5–5.0)
Alkaline Phosphatase: 57 U/L (ref 38–126)
BUN: 8 mg/dL (ref 6–20)
CHLORIDE: 109 mmol/L (ref 101–111)
CO2: 21 mmol/L — ABNORMAL LOW (ref 22–32)
CREATININE: 0.84 mg/dL (ref 0.44–1.00)
Calcium: 9.4 mg/dL (ref 8.9–10.3)
Glucose, Bld: 95 mg/dL (ref 65–99)
POTASSIUM: 3.5 mmol/L (ref 3.5–5.1)
Sodium: 138 mmol/L (ref 135–145)
Total Bilirubin: 0.7 mg/dL (ref 0.3–1.2)
Total Protein: 7.5 g/dL (ref 6.5–8.1)

## 2018-01-12 LAB — CBC WITH DIFFERENTIAL/PLATELET
Basophils Absolute: 0 10*3/uL (ref 0.0–0.1)
Basophils Relative: 0 %
EOS ABS: 0.1 10*3/uL (ref 0.0–0.7)
Eosinophils Relative: 1 %
HCT: 38.6 % (ref 36.0–46.0)
HEMOGLOBIN: 13.5 g/dL (ref 12.0–15.0)
LYMPHS ABS: 2.1 10*3/uL (ref 0.7–4.0)
Lymphocytes Relative: 28 %
MCH: 33.8 pg (ref 26.0–34.0)
MCHC: 35 g/dL (ref 30.0–36.0)
MCV: 96.5 fL (ref 78.0–100.0)
MONO ABS: 0.6 10*3/uL (ref 0.1–1.0)
MONOS PCT: 8 %
Neutro Abs: 4.7 10*3/uL (ref 1.7–7.7)
Neutrophils Relative %: 63 %
PLATELETS: 176 10*3/uL (ref 150–400)
RBC: 4 MIL/uL (ref 3.87–5.11)
RDW: 12.3 % (ref 11.5–15.5)
WBC: 7.4 10*3/uL (ref 4.0–10.5)

## 2018-01-12 LAB — URINALYSIS, MICROSCOPIC (REFLEX)

## 2018-01-12 LAB — URINALYSIS, ROUTINE W REFLEX MICROSCOPIC
Glucose, UA: NEGATIVE mg/dL
Ketones, ur: >80 mg/dL — AB
NITRITE: NEGATIVE
PH: 6 (ref 5.0–8.0)
Protein, ur: NEGATIVE mg/dL

## 2018-01-12 LAB — LIPASE, BLOOD: LIPASE: 27 U/L (ref 11–51)

## 2018-01-12 LAB — PREGNANCY, URINE: Preg Test, Ur: NEGATIVE

## 2018-01-12 MED ORDER — CEPHALEXIN 500 MG PO CAPS: 500.0000 mg | ORAL_CAPSULE | Freq: Two times a day (BID) | ORAL | 0 refills | Status: AC

## 2018-01-12 MED ORDER — DIPHENHYDRAMINE HCL 50 MG/ML IJ SOLN
12.5000 mg | Freq: Once | INTRAMUSCULAR | Status: AC
  Administered 2018-01-12: 12.5 mg via INTRAVENOUS
  Filled 2018-01-12: qty 1

## 2018-01-12 MED ORDER — METOCLOPRAMIDE HCL 5 MG/ML IJ SOLN
10.0000 mg | Freq: Once | INTRAMUSCULAR | Status: AC
  Administered 2018-01-12: 10 mg via INTRAVENOUS
  Filled 2018-01-12: qty 2

## 2018-01-12 MED ORDER — SODIUM CHLORIDE 0.9 % IV BOLUS
500.0000 mL | Freq: Once | INTRAVENOUS | Status: AC
  Administered 2018-01-12: 500 mL via INTRAVENOUS

## 2018-01-12 NOTE — ED Notes (Signed)
Pt on Auto VS q30

## 2018-01-12 NOTE — Discharge Instructions (Signed)
As discussed, make sure that you drink plenty of fluids and  stay well-hydrated keeping your urine clear.  Take your entire course of antibiotics even if you feel better.  Get plenty of rest and follow-up with your primary care provider or the wellness center to establish care with a primary care provider. Return if symptoms worsen, bad headache, visual changes, vomiting or other new concerning symptoms in the meantime.

## 2018-01-12 NOTE — ED Triage Notes (Signed)
C/o migraine, n/v started 11pm-NAD-steady gait

## 2018-01-12 NOTE — ED Notes (Signed)
ED Provider at bedside. 
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
## 2018-01-13 LAB — URINE CULTURE

## 2018-01-22 MED FILL — CEPHALEXIN 500 MG CAPSULE: 500 | 7 days supply | Qty: 14 | Fill #0

## 2018-03-09 ENCOUNTER — Encounter (HOSPITAL_BASED_OUTPATIENT_CLINIC_OR_DEPARTMENT_OTHER): Payer: Self-pay

## 2018-03-09 ENCOUNTER — Emergency Department (HOSPITAL_BASED_OUTPATIENT_CLINIC_OR_DEPARTMENT_OTHER)
Admission: EM | Admit: 2018-03-09 | Discharge: 2018-03-09 | Disposition: A | Discharge status: Home / Self Care | Payer: Self-pay | Attending: Emergency Medicine | Admitting: Emergency Medicine

## 2018-03-09 ENCOUNTER — Other Ambulatory Visit: Payer: Self-pay

## 2018-03-09 DIAGNOSIS — R300 Dysuria: Secondary | ICD-10-CM | POA: Insufficient documentation

## 2018-03-09 DIAGNOSIS — Z87891 Personal history of nicotine dependence: Secondary | ICD-10-CM | POA: Insufficient documentation

## 2018-03-09 DIAGNOSIS — R110 Nausea: Secondary | ICD-10-CM | POA: Insufficient documentation

## 2018-03-09 LAB — URINALYSIS, MICROSCOPIC (REFLEX)

## 2018-03-09 LAB — URINALYSIS, ROUTINE W REFLEX MICROSCOPIC
GLUCOSE, UA: NEGATIVE mg/dL
Ketones, ur: NEGATIVE mg/dL
Nitrite: NEGATIVE
Protein, ur: NEGATIVE mg/dL
Specific Gravity, Urine: >1.03 — ABNORMAL HIGH (ref 1.005–1.030)
pH: 5.5 (ref 5.0–8.0)

## 2018-03-09 LAB — PREGNANCY, URINE: PREG TEST UR: NEGATIVE

## 2018-03-09 MED ORDER — ONDANSETRON 8 MG PO TBDP: 8.0000 mg | ORAL_TABLET | Freq: Three times a day (TID) | ORAL | 0 refills | Status: DC | PRN

## 2018-03-09 MED ORDER — FLUCONAZOLE 100 MG PO TABS
150.0000 mg | ORAL_TABLET | Freq: Once | ORAL | Status: AC
  Administered 2018-03-09: 150 mg via ORAL
  Filled 2018-03-09: qty 1

## 2018-03-09 MED ORDER — ONDANSETRON 8 MG PO TBDP
8.0000 mg | ORAL_TABLET | Freq: Once | ORAL | Status: AC
  Administered 2018-03-09: 8 mg via ORAL
  Filled 2018-03-09: qty 1

## 2018-03-09 NOTE — ED Triage Notes (Signed)
Pt c/o 4/10 abd pain w/ nausea that started last night after eating at a resturant. Pt would also like to be treated for possible yeast infection. Pt A+OX4.

## 2018-03-09 NOTE — ED Provider Notes (Signed)
[facility] Provider Note: [name], MD, FACEP  CSN: [id number] MRN: [id number] ARRIVAL: 03/09/18 at 0541 ROOM: [facility]11/[facility]11   CHIEF COMPLAINT  Nausea   HISTORY OF PRESENT ILLNESS  03/09/18 5:53 AM Julia Goodwin is a 22 y.o. female who complains of nausea that began yesterday evening after eating in a restaurant.  She has felt like she was going to vomit on at least 2 occasions but has not vomited.  She denies diarrhea.  She is also having mild suprapubic discomfort which she rates as a 4 out of 10.  She is having some burning with urination which she believes is either due to a urinary tract infection or a yeast infection.  She denies vaginal bleeding or discharge.   Past Medical History:  Diagnosis Date  . Headache   . Heart murmur     History reviewed. No pertinent surgical history.  History reviewed. No pertinent family history.  Social History   Tobacco Use  . Smoking status: Former Smoker    Types: Cigarettes  . Smokeless tobacco: Never Used  Substance Use Topics  . Alcohol use: No  . Drug use: No    Prior to Admission medications   Not on File    Allergies Patient has no known allergies.   REVIEW OF SYSTEMS  Negative except as noted here or in the History of Present Illness.   PHYSICAL EXAMINATION  Initial Vital Signs Blood pressure 125/67, pulse 74, temperature 98.6 F (37 C), temperature source Oral, resp. rate 16, height 5\' 1"  (1.549 m), weight 47.6 kg (105 lb), SpO2 100 %, unknown if currently breastfeeding.  Examination General: Well-developed, well-nourished female in no acute distress; appearance consistent with age of record HENT: normocephalic; atraumatic Eyes: pupils equal, round and reactive to light; extraocular muscles intact Neck: supple Heart: regular rate and rhythm Lungs: clear to auscultation bilaterally Abdomen: soft; nondistended; nontender; no masses or hepatosplenomegaly; bowel sounds present Extremities:  No deformity; full range of motion; pulses normal Neurologic: Awake, alert and oriented; motor function intact in all extremities and symmetric; no facial droop Skin: Warm and dry Psychiatric: Normal mood and affect   RESULTS  Summary of this visit's results, reviewed by myself:   EKG Interpretation  Date/Time:    Ventricular Rate:    PR Interval:    QRS Duration:   QT Interval:    QTC Calculation:   R Axis:     Text Interpretation:        Laboratory Studies: Results for orders placed or performed during the hospital encounter of 03/09/18 (from the past 24 hour(s))  Urinalysis, Routine w reflex microscopic     Status: Abnormal   Collection Time: 03/09/18  5:54 AM  Result Value Ref Range   Color, Urine YELLOW YELLOW   APPearance CLEAR CLEAR   Specific Gravity, Urine >1.030 (H) 1.005 - 1.030   pH 5.5 5.0 - 8.0   Glucose, UA NEGATIVE NEGATIVE mg/dL   Hgb urine dipstick SMALL (A) NEGATIVE   Bilirubin Urine SMALL (A) NEGATIVE   Ketones, ur NEGATIVE NEGATIVE mg/dL   Protein, ur NEGATIVE NEGATIVE mg/dL   Nitrite NEGATIVE NEGATIVE   Leukocytes, UA TRACE (A) NEGATIVE  Pregnancy, urine     Status: None   Collection Time: 03/09/18  5:54 AM  Result Value Ref Range   Preg Test, Ur NEGATIVE NEGATIVE  Urinalysis, Microscopic (reflex)     Status: Abnormal   Collection Time: 03/09/18  5:54 AM  Result Value Ref Range  RBC / HPF 0-5 0 - 5 RBC/hpf   WBC, UA 0-5 0 - 5 WBC/hpf   Bacteria, UA FEW (A) NONE SEEN   Squamous Epithelial / LPF 0-5 0 - 5   Mucus PRESENT    Imaging Studies: No results found.  ED COURSE and MDM  Nursing notes and initial vitals signs, including pulse oximetry, reviewed.  Vitals:   03/09/18 0550 03/09/18 0553  BP: 125/67   Pulse: 74   Resp: 16   Temp: 98.6 F (37 C)   TempSrc: Oral   SpO2: 100%   Weight:  47.6 kg (105 lb)  Height:  5\' 1"  (1.549 m)   The patient's friend is also here with nausea, vomiting and diarrhea after eating at the same  restaurant.  I suspect they may have contracted a foodborne illness.  Her urinalysis is not diagnostic of urinary tract infection.  We will treat with Diflucan for possible yeast infection as she is does not always show up on diagnostic studies.  GC and Chlamydia are pending.  PROCEDURES    ED DIAGNOSES     ICD-10-CM   1. Nausea without vomiting R11.0   2. Dysuria R30.0        Eragon Hammond, Jonny RuizJohn, MD 03/09/18 [phone]

## 2018-03-12 LAB — GC/CHLAMYDIA PROBE AMP (~~LOC~~) NOT AT ARMC
CHLAMYDIA, DNA PROBE: NEGATIVE
NEISSERIA GONORRHEA: NEGATIVE

## 2018-03-14 ENCOUNTER — Emergency Department (HOSPITAL_BASED_OUTPATIENT_CLINIC_OR_DEPARTMENT_OTHER)
Admission: EM | Admit: 2018-03-14 | Discharge: 2018-03-14 | Disposition: A | Discharge status: Home / Self Care | Payer: Self-pay | Attending: Emergency Medicine | Admitting: Emergency Medicine

## 2018-03-14 ENCOUNTER — Encounter (HOSPITAL_BASED_OUTPATIENT_CLINIC_OR_DEPARTMENT_OTHER): Payer: Self-pay | Admitting: Emergency Medicine

## 2018-03-14 ENCOUNTER — Other Ambulatory Visit: Payer: Self-pay

## 2018-03-14 DIAGNOSIS — Z87891 Personal history of nicotine dependence: Secondary | ICD-10-CM | POA: Insufficient documentation

## 2018-03-14 DIAGNOSIS — R112 Nausea with vomiting, unspecified: Secondary | ICD-10-CM | POA: Insufficient documentation

## 2018-03-14 LAB — COMPREHENSIVE METABOLIC PANEL
ALT: 15 U/L (ref 0–44)
AST: 20 U/L (ref 15–41)
Albumin: 4.2 g/dL (ref 3.5–5.0)
Alkaline Phosphatase: 46 U/L (ref 38–126)
Anion gap: 11 (ref 5–15)
BUN: 12 mg/dL (ref 6–20)
CO2: 24 mmol/L (ref 22–32)
Calcium: 8.7 mg/dL — ABNORMAL LOW (ref 8.9–10.3)
Chloride: 105 mmol/L (ref 98–111)
Creatinine, Ser: 0.78 mg/dL (ref 0.44–1.00)
GFR calc Af Amer: >60 mL/min (ref >60)
GFR calc non Af Amer: >60 mL/min (ref >60)
Glucose, Bld: 97 mg/dL (ref 70–99)
Potassium: 2.7 mmol/L — CL (ref 3.5–5.1)
Sodium: 140 mmol/L (ref 135–145)
Total Bilirubin: 1.1 mg/dL (ref 0.3–1.2)
Total Protein: 6.8 g/dL (ref 6.5–8.1)

## 2018-03-14 LAB — CBC WITH DIFFERENTIAL/PLATELET
Basophils Absolute: 0 10*3/uL (ref 0.0–0.1)
Basophils Relative: 0 %
Eosinophils Absolute: 0 10*3/uL (ref 0.0–0.7)
Eosinophils Relative: 0 %
HCT: 40.2 % (ref 36.0–46.0)
Hemoglobin: 13.8 g/dL (ref 12.0–15.0)
Lymphocytes Relative: 20 %
Lymphs Abs: 1.8 10*3/uL (ref 0.7–4.0)
MCH: 32.9 pg (ref 26.0–34.0)
MCHC: 34.3 g/dL (ref 30.0–36.0)
MCV: 95.9 fL (ref 78.0–100.0)
Monocytes Absolute: 0.6 10*3/uL (ref 0.1–1.0)
Monocytes Relative: 6 %
Neutro Abs: 6.7 10*3/uL (ref 1.7–7.7)
Neutrophils Relative %: 74 %
Platelets: 182 10*3/uL (ref 150–400)
RBC: 4.19 MIL/uL (ref 3.87–5.11)
RDW: 11.7 % (ref 11.5–15.5)
WBC: 9.1 10*3/uL (ref 4.0–10.5)

## 2018-03-14 LAB — URINALYSIS, ROUTINE W REFLEX MICROSCOPIC
Glucose, UA: NEGATIVE mg/dL
Ketones, ur: >80 mg/dL — AB
Nitrite: NEGATIVE
Protein, ur: NEGATIVE mg/dL
Specific Gravity, Urine: 1.02 (ref 1.005–1.030)
pH: 7.5 (ref 5.0–8.0)

## 2018-03-14 LAB — LIPASE, BLOOD: Lipase: 26 U/L (ref 11–51)

## 2018-03-14 LAB — URINALYSIS, MICROSCOPIC (REFLEX)

## 2018-03-14 LAB — PREGNANCY, URINE: Preg Test, Ur: NEGATIVE

## 2018-03-14 MED ORDER — CAPSAICIN 0.025 % EX CREA
TOPICAL_CREAM | Freq: Once | CUTANEOUS | Status: AC
  Administered 2018-03-14: 18:00:00 via TOPICAL
  Filled 2018-03-14 (×2): qty 60

## 2018-03-14 MED ORDER — SUCRALFATE 1 GM/10ML PO SUSP: 1.0000 g | Freq: Three times a day (TID) | ORAL | 0 refills | Status: DC

## 2018-03-14 MED ORDER — FAMOTIDINE 20 MG PO TABS: 20.0000 mg | ORAL_TABLET | Freq: Two times a day (BID) | ORAL | 0 refills | Status: DC

## 2018-03-14 MED ORDER — SODIUM CHLORIDE 0.9 % IV BOLUS
1000.0000 mL | Freq: Once | INTRAVENOUS | Status: AC
  Administered 2018-03-14: 1000 mL via INTRAVENOUS

## 2018-03-14 MED ORDER — POTASSIUM CHLORIDE CRYS ER 20 MEQ PO TBCR: 20.0000 meq | EXTENDED_RELEASE_TABLET | Freq: Every day | ORAL | 0 refills | Status: DC

## 2018-03-14 MED ORDER — METOCLOPRAMIDE HCL 10 MG PO TABS: 10.0000 mg | ORAL_TABLET | Freq: Four times a day (QID) | ORAL | 0 refills | Status: DC

## 2018-03-14 MED ORDER — POTASSIUM CHLORIDE CRYS ER 20 MEQ PO TBCR
60.0000 meq | EXTENDED_RELEASE_TABLET | Freq: Once | ORAL | Status: AC
  Administered 2018-03-14: 60 meq via ORAL
  Filled 2018-03-14: qty 3

## 2018-03-14 MED ORDER — METOCLOPRAMIDE HCL 5 MG/ML IJ SOLN
10.0000 mg | Freq: Once | INTRAMUSCULAR | Status: AC
  Administered 2018-03-14: 10 mg via INTRAVENOUS
  Filled 2018-03-14: qty 2

## 2018-03-14 NOTE — ED Notes (Signed)
Pt states "I am feeling better." when asked to describe how she feels.

## 2018-03-14 NOTE — ED Triage Notes (Signed)
Reports vomiting since Sunday.  Denies diarrhea.

## 2018-03-14 NOTE — ED Notes (Signed)
Pt on monitor 

## 2018-03-14 NOTE — ED Provider Notes (Signed)
[facility] EMERGENCY DEPARTMENT Provider Note   CSN: [id number] Arrival date & time: 03/14/18  1651     History   Chief Complaint Chief Complaint  Patient presents with  . Emesis    HPI Julia Goodwin is a 22 y.o. female who presents with a 4-day history of nausea and emesis.  Patient has had associated abdominal pain.  She has been evaluated twice in the past 4 days for the same symptoms.  She has been taking Zofran at home without relief.  She is unable to keep anything down.  She has had abdominal soreness all over related to her vomiting.  She denies diarrhea.  She reports drinking a lot of alcohol over the weekend and using "little to no" marijuana.  Patient reports she had similar symptoms when she had "alcohol poisoning" in the past.  Patient denies any fevers, chest pain, shortness of breath, urinary symptoms, abnormal vaginal bleeding or discharge.  She was seen yesterday at [facility] and had a negative abdominal CT.  HPI  Past Medical History:  Diagnosis Date  . Headache   . Heart murmur     There are no active problems to display for this patient.   History reviewed. No pertinent surgical history.   OB History    Gravida  1   Para      Term      Preterm      AB      Living        SAB      TAB      Ectopic      Multiple      Live Births               Home Medications    Prior to Admission medications   Medication Sig Start Date End Date Taking? Authorizing Provider  famotidine (PEPCID) 20 MG tablet Take 1 tablet (20 mg total) by mouth 2 (two) times daily. 03/14/18   Dejean Tribby, Waylan Boga, PA-C  metoCLOPramide (REGLAN) 10 MG tablet Take 1 tablet (10 mg total) by mouth every 6 (six) hours. 03/14/18   Tali Cleaves, Waylan Boga, PA-C  ondansetron (ZOFRAN ODT) 8 MG disintegrating tablet Take 1 tablet (8 mg total) by mouth every 8 (eight) hours as needed for nausea or vomiting. 03/09/18   Molpus, Jonny Ruiz, MD  potassium chloride SA  (K-DUR,KLOR-CON) 20 MEQ tablet Take 1 tablet (20 mEq total) by mouth daily for 3 doses. 03/14/18 03/17/18  Emi Holes, PA-C  sucralfate (CARAFATE) 1 GM/10ML suspension Take 10 mLs (1 g total) by mouth 4 (four) times daily -  with meals and at bedtime. 03/14/18   Emi Holes, PA-C    Family History History reviewed. No pertinent family history.  Social History Social History   Tobacco Use  . Smoking status: Former Smoker    Types: Cigarettes  . Smokeless tobacco: Never Used  Substance Use Topics  . Alcohol use: No  . Drug use: Yes    Types: Marijuana     Allergies   Patient has no known allergies.   Review of Systems Review of Systems  Constitutional: Negative for chills and fever.  HENT: Negative for facial swelling and sore throat.   Respiratory: Negative for shortness of breath.   Cardiovascular: Negative for chest pain.  Gastrointestinal: Positive for abdominal pain, nausea and vomiting. Negative for blood in stool and diarrhea.  Genitourinary: Negative for dysuria.  Musculoskeletal: Negative for back pain.  Skin:  Negative for rash and wound.  Neurological: Negative for headaches.  Psychiatric/Behavioral: The patient is not nervous/anxious.      Physical Exam Updated Vital Signs BP 111/70 (BP Location: Right Arm)   Pulse 71   Temp 98.4 F (36.9 C) (Oral)   Resp 14   Ht 5\' 1"  (1.549 m)   Wt 47.6 kg (105 lb)   SpO2 100%   BMI 19.84 kg/m   Physical Exam  Constitutional: She appears well-developed and well-nourished. No distress.  Patient actively dry heaving  HENT:  Head: Normocephalic and atraumatic.  Mouth/Throat: Oropharynx is clear and moist. No oropharyngeal exudate.  Eyes: Pupils are equal, round, and reactive to light. Conjunctivae are normal. Right eye exhibits no discharge. Left eye exhibits no discharge. No scleral icterus.  Neck: Normal range of motion. Neck supple. No thyromegaly present.  Cardiovascular: Normal rate, regular rhythm,  normal heart sounds and intact distal pulses. Exam reveals no gallop and no friction rub.  No murmur heard. Pulmonary/Chest: Effort normal and breath sounds normal. No stridor. No respiratory distress. She has no wheezes. She has no rales.  Abdominal: Soft. Bowel sounds are normal. She exhibits no distension. There is generalized tenderness. There is no rebound and no guarding.  Musculoskeletal: She exhibits no edema.  Lymphadenopathy:    She has no cervical adenopathy.  Neurological: She is alert. Coordination normal.  Skin: Skin is warm and dry. No rash noted. She is not diaphoretic. No pallor.  Psychiatric: She has a normal mood and affect.  Nursing note and vitals reviewed.    ED Treatments / Results  Labs (all labs ordered are listed, but only abnormal results are displayed) Labs Reviewed  URINALYSIS, ROUTINE W REFLEX MICROSCOPIC - Abnormal; Notable for the following components:      Result Value   APPearance CLOUDY (*)    Hgb urine dipstick MODERATE (*)    Bilirubin Urine SMALL (*)    Ketones, ur >80 (*)    Leukocytes, UA TRACE (*)    All other components within normal limits  COMPREHENSIVE METABOLIC PANEL - Abnormal; Notable for the following components:   Potassium 2.7 (*)    Calcium 8.7 (*)    All other components within normal limits  URINALYSIS, MICROSCOPIC (REFLEX) - Abnormal; Notable for the following components:   Bacteria, UA FEW (*)    All other components within normal limits  PREGNANCY, URINE  LIPASE, BLOOD  CBC WITH DIFFERENTIAL/PLATELET    EKG EKG Interpretation  Date/Time:  Wednesday March 14 2018 19:13:44 EDT Ventricular Rate:  57 PR Interval:    QRS Duration: 95 QT Interval:  411 QTC Calculation: 401 R Axis:   77 Text Interpretation:  Sinus rhythm ST elev, probable normal early repol pattern No STEMI.  Confirmed by Alona BeneLong, Joshua [phone] on 03/14/2018 7:21:30 PM   Radiology No results found.  Procedures Procedures (including critical care  time)  Medications Ordered in ED Medications  sodium chloride 0.9 % bolus 1,000 mL ( Intravenous Stopped 03/14/18 1843)  metoCLOPramide (REGLAN) injection 10 mg (10 mg Intravenous Given 03/14/18 1734)  capsaicin (ZOSTRIX) 0.025 % cream ( Topical Given 03/14/18 1736)  potassium chloride SA (K-DUR,KLOR-CON) CR tablet 60 mEq (60 mEq Oral Given 03/14/18 1919)  sodium chloride 0.9 % bolus 1,000 mL (0 mLs Intravenous Stopped 03/14/18 2039)     Initial Impression / Assessment and Plan / ED Course  I have reviewed the triage vital signs and the nursing notes.  Pertinent labs & imaging results that were available  during my care of the patient were reviewed by me and considered in my medical decision making (see chart for details).     Patient with nausea and vomiting. Suspect viral illness versus hyperemesis. Found to have hypoK at 2.7 today. Replacement initiated in the ED and discharged home with remainder. Patient also with dehydration with ketones in the urine, as well as moderate hematuria, trace leukocytes. Patient without urinary symptoms, suspect asymptomatic bactiuria. Patient given 2L NS. Her vomiting was controlled with capsaicin and Reglan. She reports she is feeling much better and is tolerating oral fluids. Her repeat abdominal exam is improved. CTAP yesterday was negative. Will discharge home with Reglan, Pepcid, Carafate, and K-dur. Return precautions discussed. Patient understands and agrees with plan. Patient vitals stable and discharged in satisfactory condition.  Final Clinical Impressions(s) / ED Diagnoses   Final diagnoses:  Non-intractable vomiting with nausea, unspecified vomiting type    ED Discharge Orders        Ordered    metoCLOPramide (REGLAN) 10 MG tablet  Every 6 hours     03/14/18 1959    sucralfate (CARAFATE) 1 GM/10ML suspension  3 times daily with meals & bedtime     03/14/18 1959    famotidine (PEPCID) 20 MG tablet  2 times daily     03/14/18 1959     potassium chloride SA (K-DUR,KLOR-CON) 20 MEQ tablet  Daily     03/14/18 1959       Emi Holes, PA-C 03/15/18 1013    Maia Plan, MD 03/15/18 1353

## 2018-03-14 NOTE — Discharge Instructions (Signed)
Take Reglan every 6 hours as needed for nausea or vomiting.  Take Carafate prior to eating or drinking anything and before bed.  Take Pepcid twice daily as prescribed.  Take potassium for the next 5 days as prescribed.  Progress your diet from clear liquids to bland foods such as bananas, rice, applesauce, toast.  Do not progress past clear liquids until you are doing well with them.

## 2018-03-14 NOTE — ED Notes (Signed)
Asked pt to give a urine specimen, she states she is unable to void,  Informed her that we did not need but a small amount and she refused to try.

## 2018-03-14 NOTE — ED Notes (Signed)
When asked if pt is still nauseated she states "I cant really tell yet.". Pt denies pain.

## 2018-03-17 ENCOUNTER — Emergency Department (HOSPITAL_BASED_OUTPATIENT_CLINIC_OR_DEPARTMENT_OTHER)
Admission: EM | Admit: 2018-03-17 | Discharge: 2018-03-17 | Disposition: A | Discharge status: Home / Self Care | Payer: Self-pay | Attending: Emergency Medicine | Admitting: Emergency Medicine

## 2018-03-17 ENCOUNTER — Encounter (HOSPITAL_BASED_OUTPATIENT_CLINIC_OR_DEPARTMENT_OTHER): Payer: Self-pay | Admitting: Emergency Medicine

## 2018-03-17 ENCOUNTER — Other Ambulatory Visit: Payer: Self-pay

## 2018-03-17 DIAGNOSIS — F17210 Nicotine dependence, cigarettes, uncomplicated: Secondary | ICD-10-CM | POA: Insufficient documentation

## 2018-03-17 DIAGNOSIS — R112 Nausea with vomiting, unspecified: Secondary | ICD-10-CM | POA: Insufficient documentation

## 2018-03-17 DIAGNOSIS — R1115 Cyclical vomiting syndrome unrelated to migraine: Secondary | ICD-10-CM

## 2018-03-17 DIAGNOSIS — Z79899 Other long term (current) drug therapy: Secondary | ICD-10-CM | POA: Insufficient documentation

## 2018-03-17 LAB — COMPREHENSIVE METABOLIC PANEL
ALK PHOS: 47 U/L (ref 38–126)
ALT: 13 U/L (ref 0–44)
ANION GAP: 11 (ref 5–15)
AST: 15 U/L (ref 15–41)
Albumin: 4.4 g/dL (ref 3.5–5.0)
BUN: 9 mg/dL (ref 6–20)
CALCIUM: 9.4 mg/dL (ref 8.9–10.3)
CO2: 24 mmol/L (ref 22–32)
Chloride: 105 mmol/L (ref 98–111)
Creatinine, Ser: 0.77 mg/dL (ref 0.44–1.00)
GFR calc Af Amer: >60 mL/min (ref >60)
GFR calc non Af Amer: >60 mL/min (ref >60)
GLUCOSE: 92 mg/dL (ref 70–99)
Potassium: 3.5 mmol/L (ref 3.5–5.1)
Sodium: 140 mmol/L (ref 135–145)
Total Bilirubin: 0.8 mg/dL (ref 0.3–1.2)
Total Protein: 7.2 g/dL (ref 6.5–8.1)

## 2018-03-17 LAB — URINALYSIS, MICROSCOPIC (REFLEX)

## 2018-03-17 LAB — URINALYSIS, ROUTINE W REFLEX MICROSCOPIC
Glucose, UA: NEGATIVE mg/dL
Ketones, ur: >80 mg/dL — AB
Leukocytes, UA: NEGATIVE
Nitrite: NEGATIVE
PH: 6.5 (ref 5.0–8.0)
Protein, ur: 30 mg/dL — AB
SPECIFIC GRAVITY, URINE: 1.025 (ref 1.005–1.030)

## 2018-03-17 LAB — CBC
HEMATOCRIT: 38.8 % (ref 36.0–46.0)
HEMOGLOBIN: 13.3 g/dL (ref 12.0–15.0)
MCH: 32.8 pg (ref 26.0–34.0)
MCHC: 34.3 g/dL (ref 30.0–36.0)
MCV: 95.6 fL (ref 78.0–100.0)
Platelets: 174 10*3/uL (ref 150–400)
RBC: 4.06 MIL/uL (ref 3.87–5.11)
RDW: 11.5 % (ref 11.5–15.5)
WBC: 8.8 10*3/uL (ref 4.0–10.5)

## 2018-03-17 LAB — LIPASE, BLOOD: Lipase: 47 U/L (ref 11–51)

## 2018-03-17 LAB — PREGNANCY, URINE: Preg Test, Ur: NEGATIVE

## 2018-03-17 MED ORDER — SODIUM CHLORIDE 0.9 % IV BOLUS
1000.0000 mL | Freq: Once | INTRAVENOUS | Status: AC
  Administered 2018-03-17: 1000 mL via INTRAVENOUS

## 2018-03-17 MED ORDER — METOCLOPRAMIDE HCL 5 MG/ML IJ SOLN
10.0000 mg | Freq: Once | INTRAMUSCULAR | Status: AC
  Administered 2018-03-17: 10 mg via INTRAVENOUS
  Filled 2018-03-17: qty 2
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
## 2018-03-17 NOTE — Discharge Instructions (Addendum)
Please return to the ED if you experience any of the following symptoms: You have blood in your vomit. Your vomit looks like coffee grounds. You have stools that are bloody or black, or stools that look like tar. You have signs of dehydration, such as: Sunken eyes. Not making tears while crying. Very dry mouth. Cracked lips. Decreased urine production. Dark urine. Urine may be the color of tea. Weakness. Sleepiness.

## 2018-03-17 NOTE — ED Triage Notes (Signed)
abd pain with vomiting x 1 week.

## 2018-03-18 LAB — URINE CULTURE: Culture: NO GROWTH

## 2018-04-02 IMAGING — CT CT ABD-PELV W/ CM
2 of 4 series · 15 of 46 positions shown, 17 images · IV contrast (APPLIED)
Comparison: 05/15/2015

CLINICAL DATA: Suprapubic abdominal pain for 1 week. Nausea and
vomiting pre seated abdominal pain. Diarrhea today.

EXAM:
CT ABDOMEN AND PELVIS WITH CONTRAST
TECHNIQUE: Multidetector CT imaging of the abdomen and pelvis was performed
using the standard protocol following bolus administration of
intravenous contrast.
CONTRAST:  100mL [id number] IOPAMIDOL ([id number]) INJECTION 61%

[Series 2: axial st · axial · 0.55mm/px · z∈[-460,-74]mm · 12 of 85 slices shown, 14 images]
[im 4/85  soft-tissue]
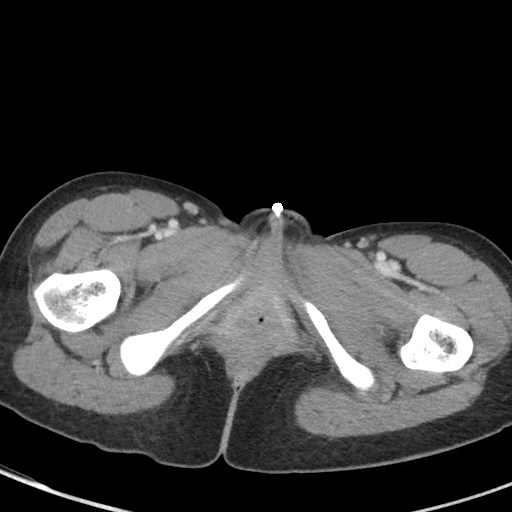
[im 4/85  bone]
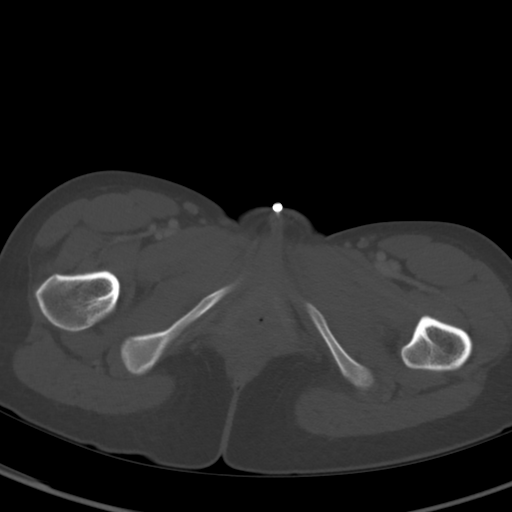
[im 11/85  soft-tissue]
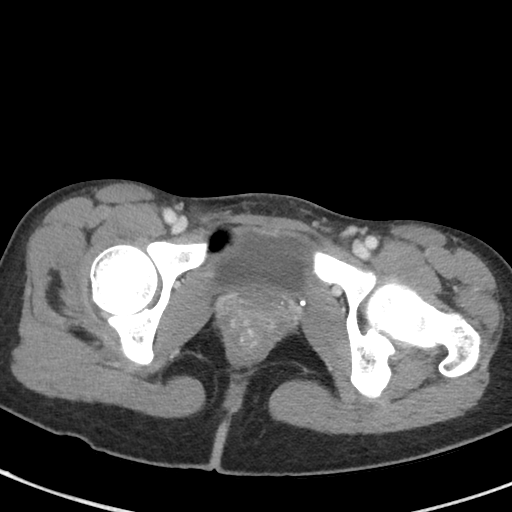
[im 19/85  soft-tissue]
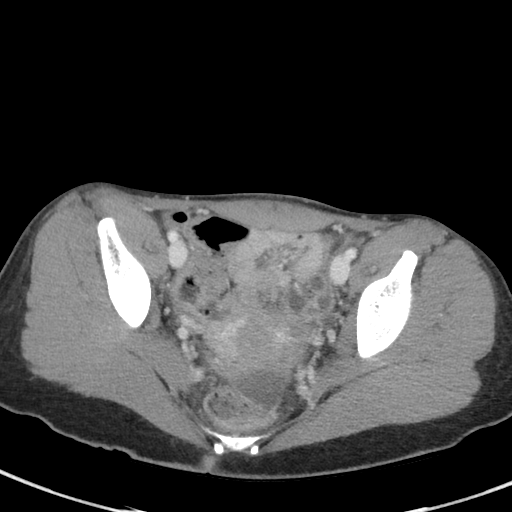
[im 26/85  soft-tissue]
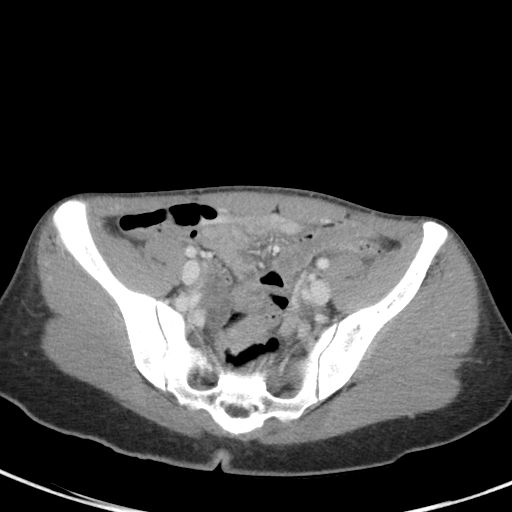
[im 33/85  soft-tissue]
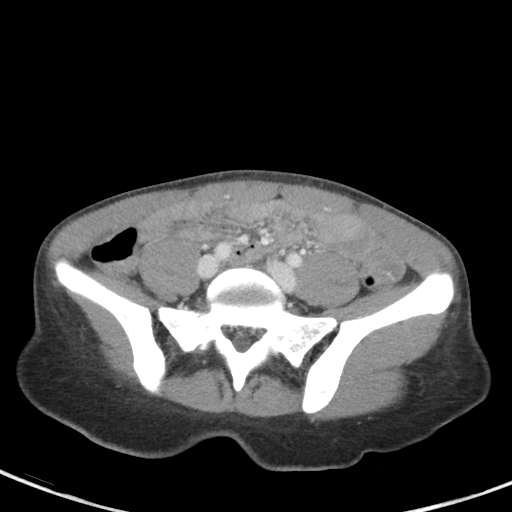
[im 41/85  soft-tissue]
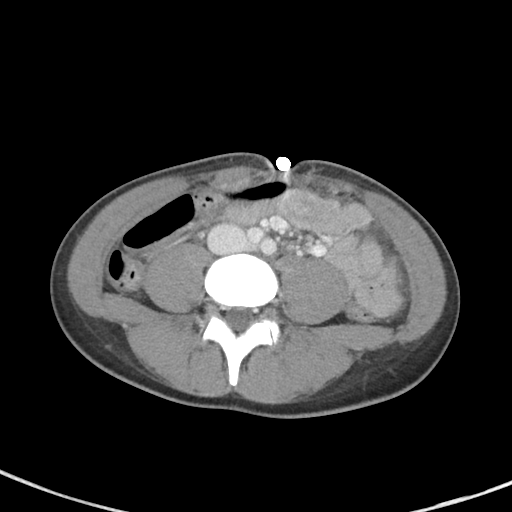
[im 44/85  soft-tissue]
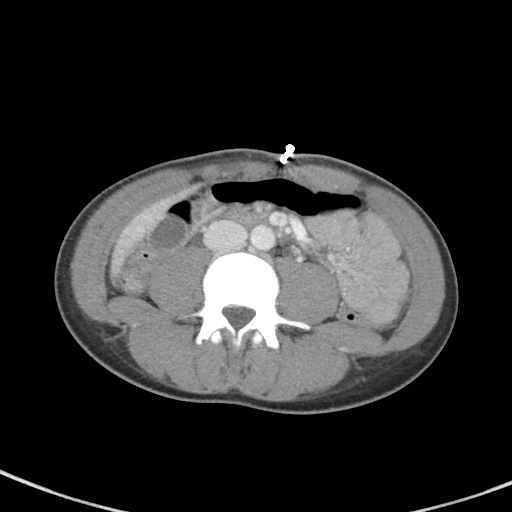
[im 52/85  soft-tissue]
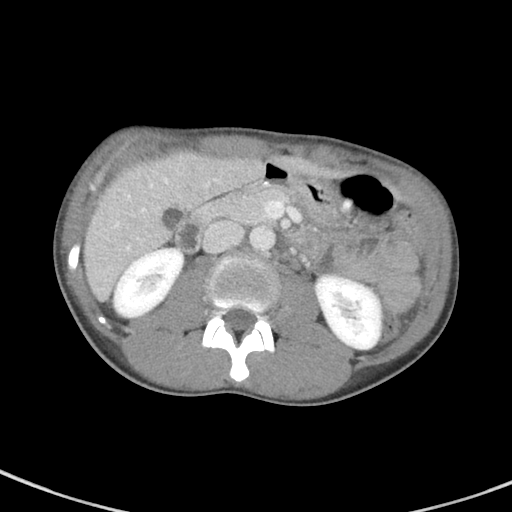
[im 59/85  soft-tissue]
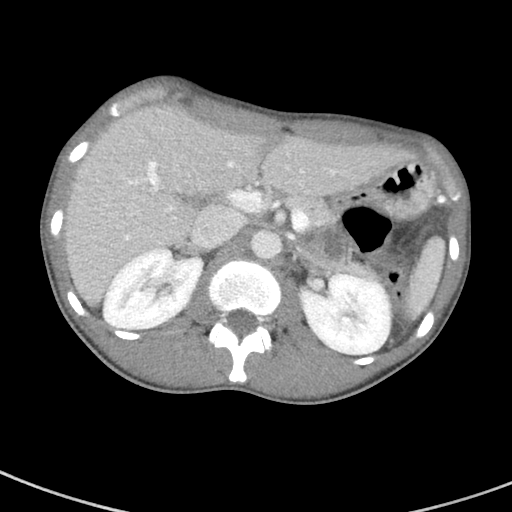
[im 59/85  bone]
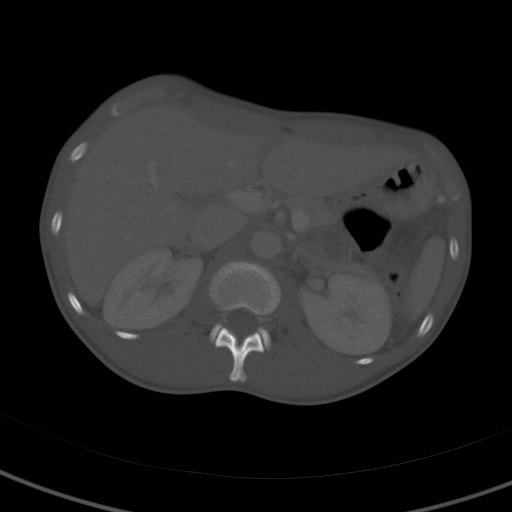
[im 66/85  soft-tissue]
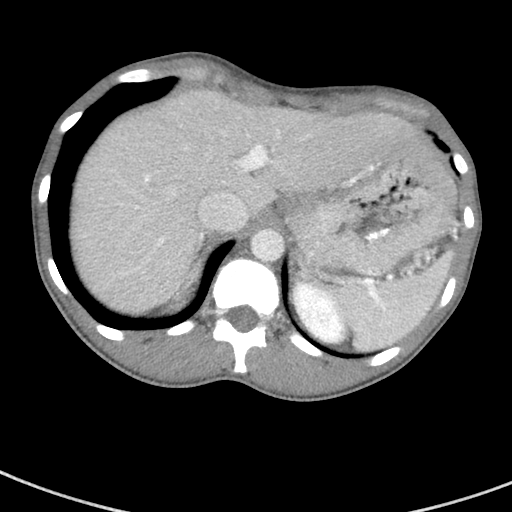
[im 74/85  soft-tissue]
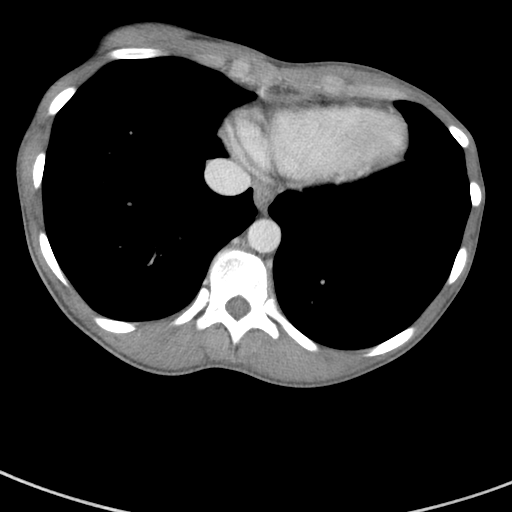
[im 81/85  soft-tissue]
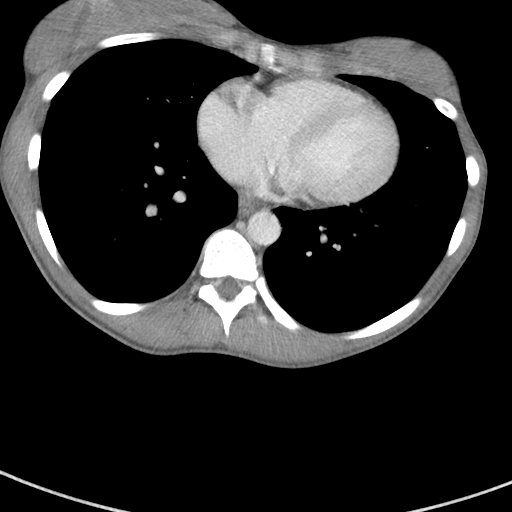

[Series 5: coronal st · coronal · 0.52mm/px · 3 of 86 slices shown]
[im 29/86  soft-tissue]
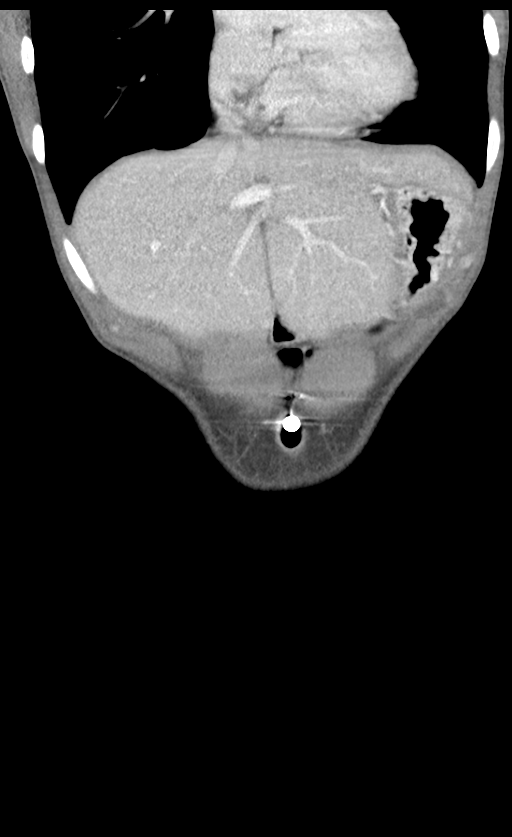
[im 38/86  soft-tissue]
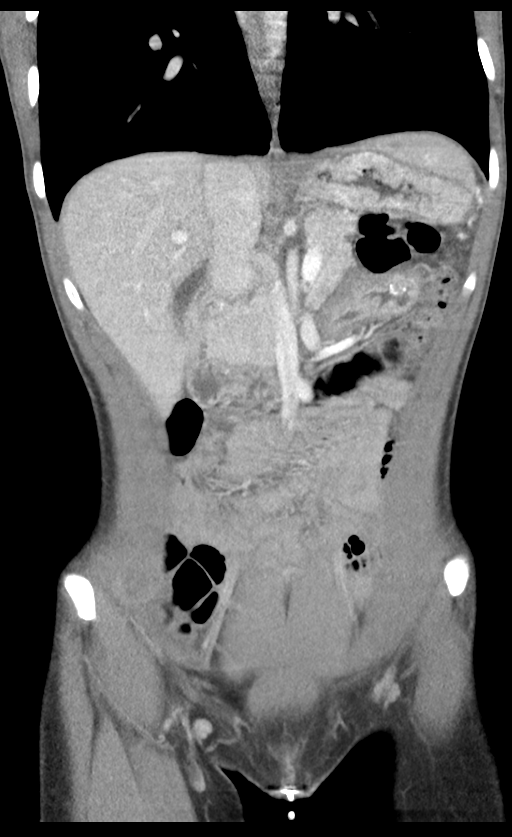
[im 48/86  soft-tissue]
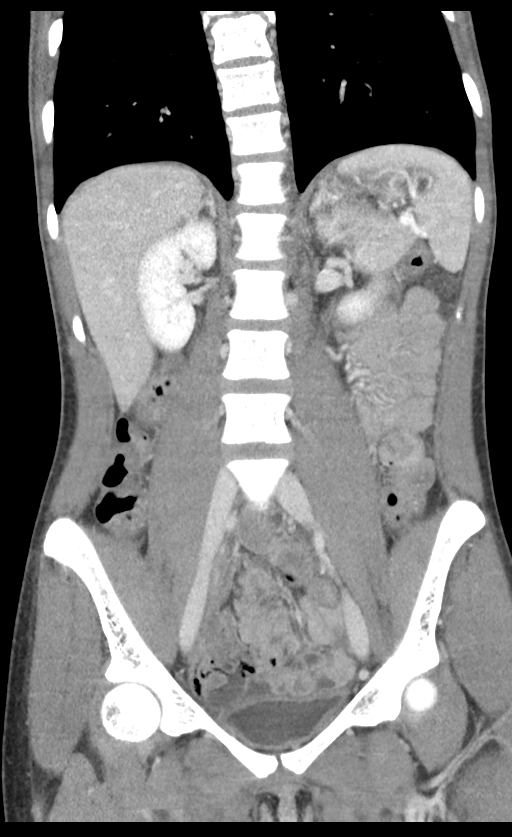

[15 of 46 positions shown; findings below may reference images not displayed]

FINDINGS: Lower chest: No acute abnormality.

Hepatobiliary: No focal liver abnormality is seen. No gallstones,
gallbladder wall thickening, or biliary dilatation.

Pancreas: Unremarkable. No pancreatic ductal dilatation or
surrounding inflammatory changes.

Spleen: Normal in size without focal abnormality.

Adrenals/Urinary Tract: Adrenal glands are unremarkable. Kidneys are
normal, without renal calculi, focal lesion, or hydronephrosis.
Bladder is unremarkable.

Stomach/Bowel: Stomach is within normal limits. Appendix is normal.
No evidence of bowel wall thickening, distention, or inflammatory
changes.

Vascular/Lymphatic: No significant vascular findings are present. No
enlarged abdominal or pelvic lymph nodes.

Reproductive: Uterus and bilateral adnexa are unremarkable.

Other: No focal inflammation.  No ascites.

Musculoskeletal: No significant skeletal lesion.
IMPRESSION: No significant abnormality.

## 2018-04-19 ENCOUNTER — Other Ambulatory Visit: Payer: Self-pay

## 2018-04-19 ENCOUNTER — Encounter (HOSPITAL_BASED_OUTPATIENT_CLINIC_OR_DEPARTMENT_OTHER): Payer: Self-pay

## 2018-04-19 ENCOUNTER — Emergency Department (HOSPITAL_BASED_OUTPATIENT_CLINIC_OR_DEPARTMENT_OTHER)
Admission: EM | Admit: 2018-04-19 | Discharge: 2018-04-19 | Disposition: A | Discharge status: Home / Self Care | Payer: Self-pay | Attending: Emergency Medicine | Admitting: Emergency Medicine

## 2018-04-19 DIAGNOSIS — R51 Headache: Secondary | ICD-10-CM | POA: Insufficient documentation

## 2018-04-19 DIAGNOSIS — M542 Cervicalgia: Secondary | ICD-10-CM | POA: Insufficient documentation

## 2018-04-19 DIAGNOSIS — F17210 Nicotine dependence, cigarettes, uncomplicated: Secondary | ICD-10-CM | POA: Insufficient documentation

## 2018-04-19 MED ORDER — METHOCARBAMOL 500 MG PO TABS: 500.0000 mg | ORAL_TABLET | Freq: Three times a day (TID) | ORAL | 0 refills | Status: DC

## 2018-04-19 MED ORDER — METHOCARBAMOL 500 MG PO TABS: 500.0000 mg | ORAL_TABLET | Freq: Three times a day (TID) | ORAL | 0 refills | Status: AC
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
## 2018-05-06 ENCOUNTER — Other Ambulatory Visit: Payer: Self-pay

## 2018-05-06 ENCOUNTER — Emergency Department (HOSPITAL_BASED_OUTPATIENT_CLINIC_OR_DEPARTMENT_OTHER)
Admission: EM | Admit: 2018-05-06 | Discharge: 2018-05-06 | Disposition: A | Discharge status: Home / Self Care | Payer: Self-pay | Attending: Emergency Medicine | Admitting: Emergency Medicine

## 2018-05-06 ENCOUNTER — Encounter (HOSPITAL_BASED_OUTPATIENT_CLINIC_OR_DEPARTMENT_OTHER): Payer: Self-pay

## 2018-05-06 DIAGNOSIS — N760 Acute vaginitis: Secondary | ICD-10-CM | POA: Insufficient documentation

## 2018-05-06 DIAGNOSIS — Z79899 Other long term (current) drug therapy: Secondary | ICD-10-CM | POA: Insufficient documentation

## 2018-05-06 DIAGNOSIS — F17210 Nicotine dependence, cigarettes, uncomplicated: Secondary | ICD-10-CM | POA: Insufficient documentation

## 2018-05-06 DIAGNOSIS — B9689 Other specified bacterial agents as the cause of diseases classified elsewhere: Secondary | ICD-10-CM | POA: Insufficient documentation

## 2018-05-06 DIAGNOSIS — F1210 Cannabis abuse, uncomplicated: Secondary | ICD-10-CM | POA: Insufficient documentation

## 2018-05-06 DIAGNOSIS — R112 Nausea with vomiting, unspecified: Secondary | ICD-10-CM | POA: Insufficient documentation

## 2018-05-06 LAB — WET PREP, GENITAL
SPERM: NONE SEEN
Trich, Wet Prep: NONE SEEN
Yeast Wet Prep HPF POC: NONE SEEN

## 2018-05-06 LAB — URINALYSIS, MICROSCOPIC (REFLEX)

## 2018-05-06 LAB — URINALYSIS, ROUTINE W REFLEX MICROSCOPIC
Bilirubin Urine: NEGATIVE
GLUCOSE, UA: NEGATIVE mg/dL
Ketones, ur: NEGATIVE mg/dL
Nitrite: NEGATIVE
PROTEIN: NEGATIVE mg/dL
SPECIFIC GRAVITY, URINE: 1.025 (ref 1.005–1.030)
pH: 6 (ref 5.0–8.0)

## 2018-05-06 LAB — PREGNANCY, URINE: Preg Test, Ur: NEGATIVE

## 2018-05-06 MED ORDER — METRONIDAZOLE 500 MG PO TABS: 500.0000 mg | ORAL_TABLET | Freq: Two times a day (BID) | ORAL | 0 refills | Status: DC

## 2018-05-06 NOTE — ED Provider Notes (Signed)
[facility] EMERGENCY DEPARTMENT Provider Note   CSN: [id number] Arrival date & time: 05/06/18  1254     History   Chief Complaint Chief Complaint  Patient presents with  . SEXUALLY TRANSMITTED DISEASE  . Nausea    HPI Julia Goodwin is a 22 y.o. female.  Patient has a history of cyclical vomiting syndrome.  But no significant vomiting symptoms going on currently.  Did have some nausea and vomiting overnight.  Patient's main concern is about her vaginal discharge.  She says sometimes when she has infections down there they go untreated it makes her vomiting worse.  Patient not concerned about pregnancy or STD exposure.  No abdominal pain no current nausea or vomiting.     Past Medical History:  Diagnosis Date  . Headache   . Heart murmur     There are no active problems to display for this patient.   History reviewed. No pertinent surgical history.   OB History    Gravida  1   Para      Term      Preterm      AB      Living        SAB      TAB      Ectopic      Multiple      Live Births               Home Medications    Prior to Admission medications   Medication Sig Start Date End Date Taking? Authorizing Provider  famotidine (PEPCID) 20 MG tablet Take 1 tablet (20 mg total) by mouth 2 (two) times daily. 03/14/18   Law, Waylan BogaAlexandra M, PA-C  metoCLOPramide (REGLAN) 10 MG tablet Take 1 tablet (10 mg total) by mouth every 6 (six) hours. 03/14/18   Law, Waylan BogaAlexandra M, PA-C  metroNIDAZOLE (FLAGYL) 500 MG tablet Take 1 tablet (500 mg total) by mouth 2 (two) times daily. 05/06/18   Vanetta MuldersZackowski, Zaiden Ludlum, MD  ondansetron (ZOFRAN ODT) 8 MG disintegrating tablet Take 1 tablet (8 mg total) by mouth every 8 (eight) hours as needed for nausea or vomiting. 03/09/18   Molpus, Jonny RuizJohn, MD  potassium chloride SA (K-DUR,KLOR-CON) 20 MEQ tablet Take 1 tablet (20 mEq total) by mouth daily for 3 doses. 03/14/18 03/17/18  Emi HolesLaw, Alexandra M, PA-C  sucralfate (CARAFATE)  1 GM/10ML suspension Take 10 mLs (1 g total) by mouth 4 (four) times daily -  with meals and at bedtime. 03/14/18   Emi HolesLaw, Alexandra M, PA-C    Family History No family history on file.  Social History Social History   Tobacco Use  . Smoking status: Current Some Day Smoker    Types: Cigarettes  . Smokeless tobacco: Never Used  Substance Use Topics  . Alcohol use: Yes    Comment: occ  . Drug use: Yes    Types: Marijuana     Allergies   Patient has no known allergies.   Review of Systems Review of Systems  Constitutional: Negative for fever.  HENT: Negative for congestion.   Eyes: Negative for redness.  Respiratory: Negative for shortness of breath.   Cardiovascular: Negative for chest pain.  Gastrointestinal: Negative for abdominal pain.  Genitourinary: Positive for vaginal discharge. Negative for dysuria and vaginal bleeding.  Musculoskeletal: Negative for back pain.  Skin: Negative for rash.  Neurological: Negative for headaches.  Hematological: Does not bruise/bleed easily.  Psychiatric/Behavioral: Negative for confusion.     Physical Exam Updated Vital Signs  BP 101/73   Pulse 66   Temp 98.1 F (36.7 C) (Oral)   Resp 18   Ht 1.549 m (5\' 1" )   Wt 43.3 kg   SpO2 100%   BMI 18.03 kg/m   Physical Exam  Constitutional: She is oriented to person, place, and time. She appears well-developed and well-nourished. No distress.  HENT:  Head: Normocephalic and atraumatic.  Mouth/Throat: Oropharynx is clear and moist.  Eyes: Pupils are equal, round, and reactive to light. Conjunctivae and EOM are normal.  Neck: Neck supple.  Cardiovascular: Normal rate, regular rhythm and normal heart sounds.  Pulmonary/Chest: Effort normal and breath sounds normal.  Abdominal: Soft. Bowel sounds are normal. There is no tenderness.  Genitourinary: Uterus normal. Vaginal discharge found.  Genitourinary Comments: Normal external genitalia.  No lesions.  Vaginal vault without any  bleeding.  There is a whitish discharge.  No cervical motion tenderness no uterine tenderness no adnexal tenderness.  Cervix appears normal.  Musculoskeletal: Normal range of motion. She exhibits no edema.  Neurological: She is alert and oriented to person, place, and time. No cranial nerve deficit or sensory deficit. She exhibits normal muscle tone. Coordination normal.  Skin: Skin is warm. No rash noted.  Nursing note and vitals reviewed.    ED Treatments / Results  Labs (all labs ordered are listed, but only abnormal results are displayed) Labs Reviewed  WET PREP, GENITAL - Abnormal; Notable for the following components:      Result Value   Clue Cells Wet Prep HPF POC PRESENT (*)    WBC, Wet Prep HPF POC MANY (*)    All other components within normal limits  URINALYSIS, ROUTINE W REFLEX MICROSCOPIC - Abnormal; Notable for the following components:   APPearance CLOUDY (*)    Hgb urine dipstick SMALL (*)    Leukocytes, UA MODERATE (*)    All other components within normal limits  URINALYSIS, MICROSCOPIC (REFLEX) - Abnormal; Notable for the following components:   Bacteria, UA MANY (*)    All other components within normal limits  URINE CULTURE  PREGNANCY, URINE  RPR  HIV ANTIBODY (ROUTINE TESTING W REFLEX)  GC/CHLAMYDIA PROBE AMP ([place]) NOT AT [facility]    EKG None  Radiology No results found.  Procedures Procedures (including critical care time)  Medications Ordered in ED Medications - No data to display   Initial Impression / Assessment and Plan / ED Course  I have reviewed the triage vital signs and the nursing notes.  Pertinent labs & imaging results that were available during my care of the patient were reviewed by me and considered in my medical decision making (see chart for details).     Offered patient full STD prophylaxis.  But she just wanted antibiotics based off of the wet prep at this time.  Cultures are pending including urine culture.  Patient's  wet prep shows some evidence of clue cells.  We will treat with Flagyl for 7 days.  Patient will be notified if the pelvic cultures are the urine culture grow anything significant.  Final Clinical Impressions(s) / ED Diagnoses   Final diagnoses:  Bacterial vaginosis    ED Discharge Orders         Ordered    metroNIDAZOLE (FLAGYL) 500 MG tablet  2 times daily     05/06/18 1540           Vanetta Mulders, MD 05/06/18 1547

## 2018-05-06 NOTE — Discharge Instructions (Addendum)
Take the antibiotic Flagyl as directed for the next 7 days.  Urine cultures pending pelvic cultures pending.  You will be called if anything significant grows on the cultures.  Return for any new or worse symptoms.  Work note provided.  Okay to return to work today.

## 2018-05-06 NOTE — ED Triage Notes (Signed)
Pt c/o N/V since last night. Pt c/o vaginal d/c and odor.

## 2018-05-07 LAB — GC/CHLAMYDIA PROBE AMP (~~LOC~~) NOT AT ARMC
Chlamydia: NEGATIVE
NEISSERIA GONORRHEA: NEGATIVE

## 2018-05-08 LAB — URINE CULTURE: Culture: NO GROWTH

## 2018-05-08 LAB — RPR: RPR: NONREACTIVE

## 2018-05-08 LAB — HIV ANTIBODY (ROUTINE TESTING W REFLEX): HIV SCREEN 4TH GENERATION: NONREACTIVE

## 2018-05-20 ENCOUNTER — Emergency Department (HOSPITAL_BASED_OUTPATIENT_CLINIC_OR_DEPARTMENT_OTHER)
Admission: EM | Admit: 2018-05-20 | Discharge: 2018-05-20 | Disposition: A | Discharge status: Home / Self Care | Payer: Self-pay | Attending: Emergency Medicine | Admitting: Emergency Medicine

## 2018-05-20 ENCOUNTER — Encounter (HOSPITAL_BASED_OUTPATIENT_CLINIC_OR_DEPARTMENT_OTHER): Payer: Self-pay | Admitting: Adult Health

## 2018-05-20 ENCOUNTER — Other Ambulatory Visit: Payer: Self-pay

## 2018-05-20 DIAGNOSIS — Z5321 Procedure and treatment not carried out due to patient leaving prior to being seen by health care provider: Secondary | ICD-10-CM | POA: Insufficient documentation

## 2018-05-20 DIAGNOSIS — R51 Headache: Secondary | ICD-10-CM | POA: Insufficient documentation

## 2018-05-20 NOTE — ED Triage Notes (Signed)
PT states, "My migraines are out of control and Ibuprofen and Bayers do not work" This migraine began yesterday, she has not taken any medication. She endorses light and sound sensitvity and nausea.

## 2018-05-20 NOTE — ED Notes (Signed)
Per registration pt went to car to lie down (around 0100) and said that they still wanted to be seen but pt never returned.

## 2018-08-20 ENCOUNTER — Encounter (HOSPITAL_BASED_OUTPATIENT_CLINIC_OR_DEPARTMENT_OTHER): Payer: Self-pay | Admitting: Emergency Medicine

## 2018-08-20 ENCOUNTER — Emergency Department (HOSPITAL_BASED_OUTPATIENT_CLINIC_OR_DEPARTMENT_OTHER)
Admission: EM | Admit: 2018-08-20 | Discharge: 2018-08-20 | Disposition: A | Discharge status: Home / Self Care | Payer: Self-pay | Attending: Emergency Medicine | Admitting: Emergency Medicine

## 2018-08-20 ENCOUNTER — Other Ambulatory Visit: Payer: Self-pay

## 2018-08-20 DIAGNOSIS — B3731 Acute candidiasis of vulva and vagina: Secondary | ICD-10-CM

## 2018-08-20 DIAGNOSIS — B373 Candidiasis of vulva and vagina: Secondary | ICD-10-CM | POA: Insufficient documentation

## 2018-08-20 DIAGNOSIS — R300 Dysuria: Secondary | ICD-10-CM | POA: Insufficient documentation

## 2018-08-20 LAB — URINALYSIS, ROUTINE W REFLEX MICROSCOPIC
BILIRUBIN URINE: NEGATIVE
Glucose, UA: NEGATIVE mg/dL
KETONES UR: NEGATIVE mg/dL
NITRITE: NEGATIVE
PROTEIN: NEGATIVE mg/dL
pH: 6 (ref 5.0–8.0)

## 2018-08-20 LAB — URINALYSIS, MICROSCOPIC (REFLEX)

## 2018-08-20 LAB — WET PREP, GENITAL
Clue Cells Wet Prep HPF POC: NONE SEEN
Sperm: NONE SEEN
TRICH WET PREP: NONE SEEN

## 2018-08-20 LAB — PREGNANCY, URINE: Preg Test, Ur: NEGATIVE

## 2018-08-20 MED ORDER — FLUCONAZOLE 150 MG PO TABS: 150.0000 mg | ORAL_TABLET | Freq: Every day | ORAL | 0 refills | Status: AC

## 2018-08-20 MED ORDER — FLUCONAZOLE 50 MG PO TABS
150.0000 mg | ORAL_TABLET | Freq: Once | ORAL | Status: AC
  Administered 2018-08-20: 150 mg via ORAL
  Filled 2018-08-20: qty 1

## 2018-08-20 MED FILL — FLUCONAZOLE 150 MG TABS: 150 | 1 days supply | Qty: 1 | Fill #0

## 2018-08-20 NOTE — ED Triage Notes (Signed)
Reports vaginal discharge without odor.  Reports vaginal irritation x 2 days.

## 2018-08-20 NOTE — ED Provider Notes (Signed)
[facility] EMERGENCY DEPARTMENT Provider Note   CSN: [id number] Arrival date & time: 08/20/18  [id number]     History   Chief Complaint Chief Complaint  Patient presents with  . Exposure to STD    HPI Julia Goodwin is a 22 y.o. female is here for evaluation of vaginal discharge and irritation.  Onset 2 days ago.  Describes irritation as intermittent itching.  The discharge is white, thick and clustered.  Her mother gave her a green pill for a bacteria or yeast infection she is not sure.  No other interventions for this.  No alleviating or aggravating factors.  Has had history of yeast, BV and STDs in the past.  She is sexually active with one female partner only without consistent condom use.  She is on Depakote and unsure of her LMP.  She denies associated fever, nausea, vomiting, abdominal pain, dysuria, hematuria, genital lesions.  HPI  Past Medical History:  Diagnosis Date  . Headache   . Heart murmur     There are no active problems to display for this patient.   History reviewed. No pertinent surgical history.   OB History    Gravida  1   Para      Term      Preterm      AB      Living        SAB      TAB      Ectopic      Multiple      Live Births               Home Medications    Prior to Admission medications   Medication Sig Start Date End Date Taking? Authorizing Provider  famotidine (PEPCID) 20 MG tablet Take 1 tablet (20 mg total) by mouth 2 (two) times daily. 03/14/18   Law, Waylan BogaAlexandra M, PA-C  fluconazole (DIFLUCAN) 150 MG tablet Take 1 tablet (150 mg total) by mouth daily for 1 day. Repeat in 72 hours if symptoms persist 08/20/18 08/21/18  Liberty HandyGibbons, Markeshia Giebel J, PA-C  metoCLOPramide (REGLAN) 10 MG tablet Take 1 tablet (10 mg total) by mouth every 6 (six) hours. 03/14/18   Law, Waylan BogaAlexandra M, PA-C  metroNIDAZOLE (FLAGYL) 500 MG tablet Take 1 tablet (500 mg total) by mouth 2 (two) times daily. 05/06/18   Vanetta MuldersZackowski, Scott, MD    ondansetron (ZOFRAN ODT) 8 MG disintegrating tablet Take 1 tablet (8 mg total) by mouth every 8 (eight) hours as needed for nausea or vomiting. 03/09/18   Molpus, Jonny RuizJohn, MD  potassium chloride SA (K-DUR,KLOR-CON) 20 MEQ tablet Take 1 tablet (20 mEq total) by mouth daily for 3 doses. 03/14/18 03/17/18  Emi HolesLaw, Alexandra M, PA-C  sucralfate (CARAFATE) 1 GM/10ML suspension Take 10 mLs (1 g total) by mouth 4 (four) times daily -  with meals and at bedtime. 03/14/18   Emi HolesLaw, Alexandra M, PA-C    Family History History reviewed. No pertinent family history.  Social History Social History   Tobacco Use  . Smoking status: Current Some Day Smoker    Types: Cigarettes  . Smokeless tobacco: Never Used  Substance Use Topics  . Alcohol use: Yes    Comment: occ  . Drug use: Yes    Types: Marijuana     Allergies   Patient has no known allergies.   Review of Systems Review of Systems  Genitourinary: Positive for vaginal discharge.       Vaginal irritation/itching   All other  systems reviewed and are negative.    Physical Exam Updated Vital Signs BP 130/77 (BP Location: Right Arm)   Pulse 75   Temp 98.6 F (37 C) (Oral)   Resp 18   Ht 5\' 1"  (1.549 m)   Wt 49.9 kg   SpO2 98%   BMI 20.78 kg/m   Physical Exam Vitals signs and nursing note reviewed.  Constitutional:      Appearance: She is well-developed.     Comments: Non toxic  HENT:     Head: Normocephalic and atraumatic.     Nose: Nose normal.  Eyes:     Conjunctiva/sclera: Conjunctivae normal.     Pupils: Pupils are equal, round, and reactive to light.  Neck:     Musculoskeletal: Normal range of motion.  Cardiovascular:     Rate and Rhythm: Normal rate and regular rhythm.  Pulmonary:     Effort: Pulmonary effort is normal.     Breath sounds: Normal breath sounds.  Abdominal:     General: Bowel sounds are normal.     Palpations: Abdomen is soft.     Tenderness: There is no abdominal tenderness.     Comments: No G/R/R. No  suprapubic or CVA tenderness. Negative Murphy's and McBurney's. Active BS to lower quadrants.   Genitourinary:    Vagina: Vaginal discharge present.     Comments:  Exam performed with RN at bedside for assistance. External genitalia without lesions.  No groin lymphadenopathy.  Vaginal mucosa pink without lesions.  Copious amount of cottage cheese likely adherent discharge on cervix. Negative whiff test. No CMT.  Nonpalpable, nontender adnexa.  Perianal skin normal without lesions. Musculoskeletal: Normal range of motion.  Skin:    General: Skin is warm and dry.     Capillary Refill: Capillary refill takes less than 2 seconds.  Neurological:     Mental Status: She is alert and oriented to person, place, and time.  Psychiatric:        Behavior: Behavior normal.      ED Treatments / Results  Labs (all labs ordered are listed, but only abnormal results are displayed) Labs Reviewed  WET PREP, GENITAL - Abnormal; Notable for the following components:      Result Value   Yeast Wet Prep HPF POC PRESENT (*)    WBC, Wet Prep HPF POC MANY (*)    All other components within normal limits  URINALYSIS, ROUTINE W REFLEX MICROSCOPIC - Abnormal; Notable for the following components:   Specific Gravity, Urine <1.005 (*)    Hgb urine dipstick TRACE (*)    Leukocytes, UA LARGE (*)    All other components within normal limits  URINALYSIS, MICROSCOPIC (REFLEX) - Abnormal; Notable for the following components:   Bacteria, UA FEW (*)    All other components within normal limits  URINE CULTURE  PREGNANCY, URINE  GC/CHLAMYDIA PROBE AMP ([place]) NOT AT [facility]    EKG None  Radiology No results found.  Procedures Procedures (including critical care time)  Medications Ordered in ED Medications  fluconazole (DIFLUCAN) tablet 150 mg (has no administration in time range)     Initial Impression / Assessment and Plan / ED Course  I have reviewed the triage vital signs and the nursing  notes.  Pertinent labs & imaging results that were available during my care of the patient were reviewed by me and considered in my medical decision making (see chart for details).  Clinical Course as of Aug 20 1056  Mon Aug 20, 2018  1040 Yeast Wet Prep HPF POC(!): PRESENT [CG]    Clinical Course User Index [CG] Liberty HandyGibbons, Kenisha Lynds J, PA-C   ddx includes vaginitis vs STD.  No sxs of UTI/pyelonephritis, abdominal complaints or tenderness.  Negative pregnancy test.  We will perform pelvic exam and await swabs.   1055: exam suggestive of yeast. This is confirmed by wet prep.  UA with large leuks, few bacteria, 21-50 WBC but she has no UTI symptoms so we will send for culture and defer abx today.  Concern for cross contamination with discharge. Given fluconazole in ER an dc with second dose to take in 72 hours for refractory symptoms.  Pt deferred empiric STD treatment today and will await results.  Return precautions given. Pt in agreement.   Final Clinical Impressions(s) / ED Diagnoses   Final diagnoses:  Yeast infection of the vagina    ED Discharge Orders         Ordered    fluconazole (DIFLUCAN) 150 MG tablet  Daily     08/20/18 1052           Liberty HandyGibbons, Krystal Teachey J, [place] JerseyPA-C 08/20/18 1057    Maia PlanLong, Joshua G, MD 08/20/18 1944

## 2018-08-20 NOTE — Discharge Instructions (Signed)
You were seen in the ER for vaginal discharge.   Swabs today showed yeast infection. This can explain symptoms.  Gonorrhea and chlamydia testing are pending.   You decided to defer STD treatment today until results return.  You will be notified via phone call of a positive results only.  If positive result, you will need to seek treatment and notify your partners.   Return for fever, abdominal pain, burning with urination, blood in urine, abnormal or unexpected vaginal discharge, genital lesions.  Take second dose of fluconazole in 72 hours of discharge has not improved.

## 2018-08-21 LAB — URINE CULTURE: Culture: <10000 — AB

## 2018-08-24 LAB — GC/CHLAMYDIA PROBE AMP (~~LOC~~) NOT AT ARMC
Chlamydia: NEGATIVE
Neisseria Gonorrhea: NEGATIVE

## 2018-10-11 IMAGING — US US OB TRANSVAGINAL
1 series · 13 of 28 positions shown · non-contrast
Comparison: CT of the abdomen and pelvis performed 05/15/2015

CLINICAL DATA: Acute onset of pelvic pain, nausea and vomiting.
Initial encounter.

EXAM:
OBSTETRIC <14 WK US AND TRANSVAGINAL OB US
TECHNIQUE: Both transabdominal and transvaginal ultrasound examinations were
performed for complete evaluation of the gestation as well as the
maternal uterus, adnexal regions, and pelvic cul-de-sac.
Transvaginal technique was performed to assess early pregnancy.

[Series 1: us ob transvaginal · 0.11mm/px · 33 acquisitions, 13 frames shown]
[im 2/33]
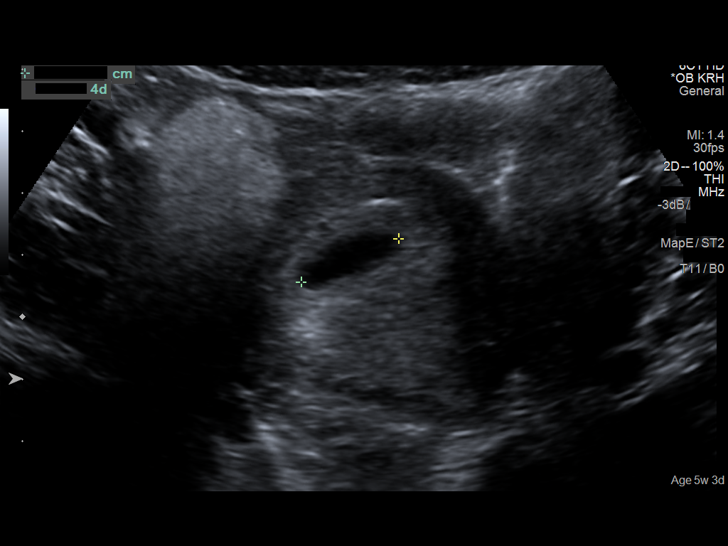
[im 4/33]
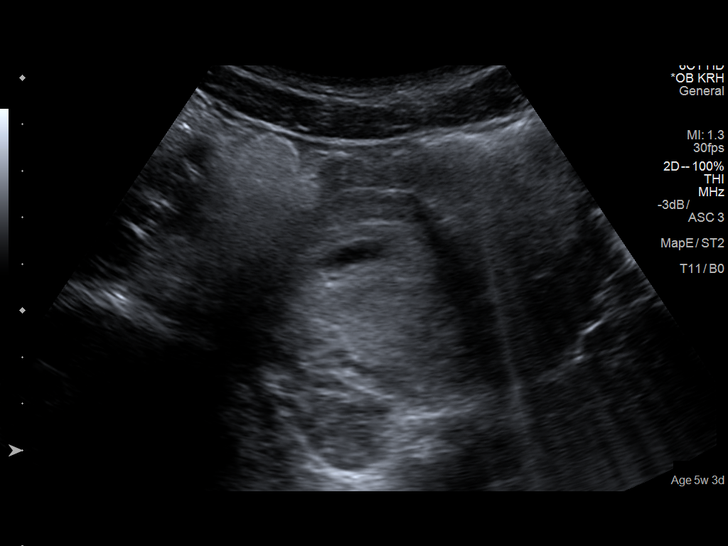
[im 6/33]
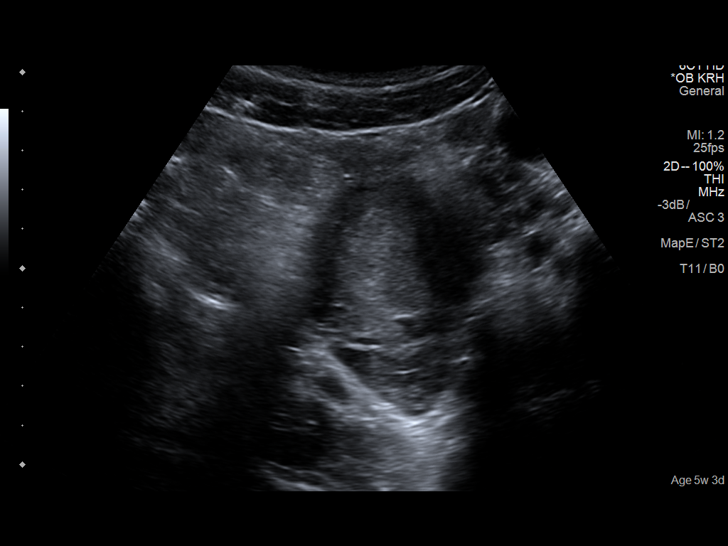
[im 9/33]
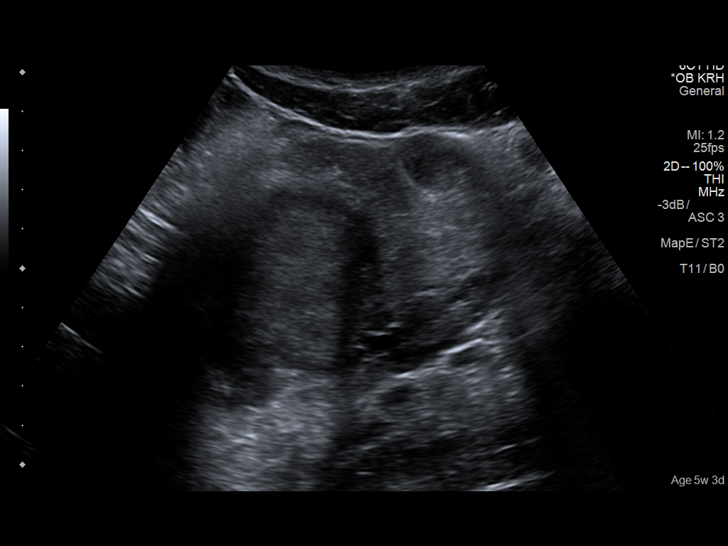
[im 11/33]
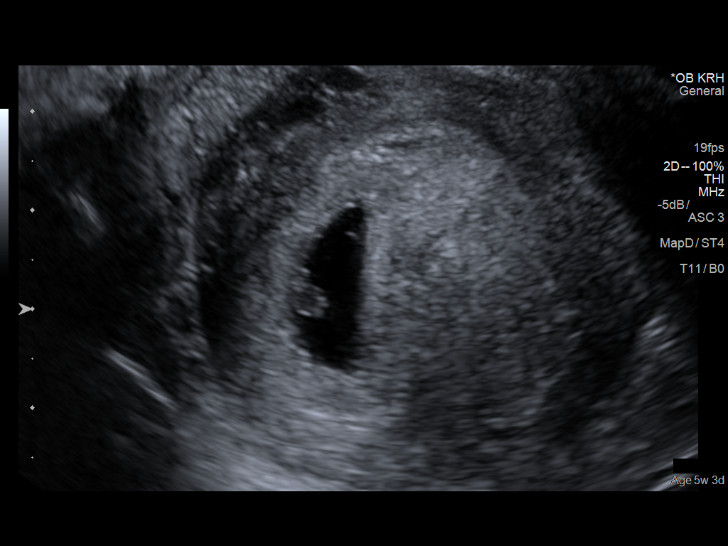
[im 14/33]
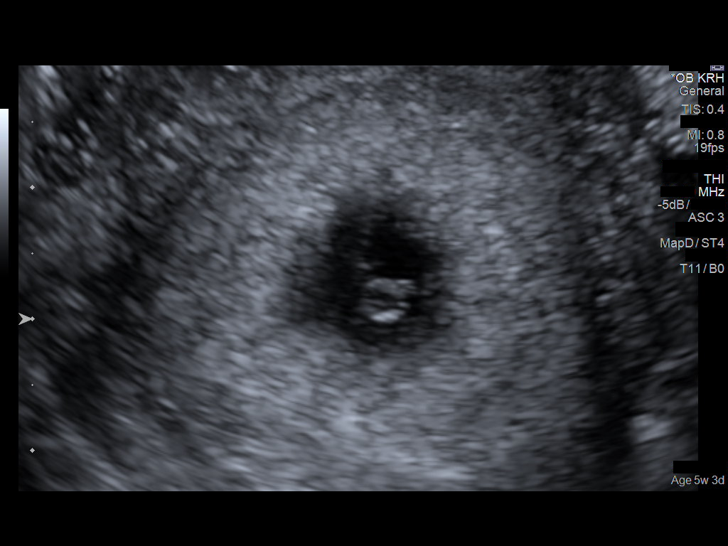
[im 17/33]
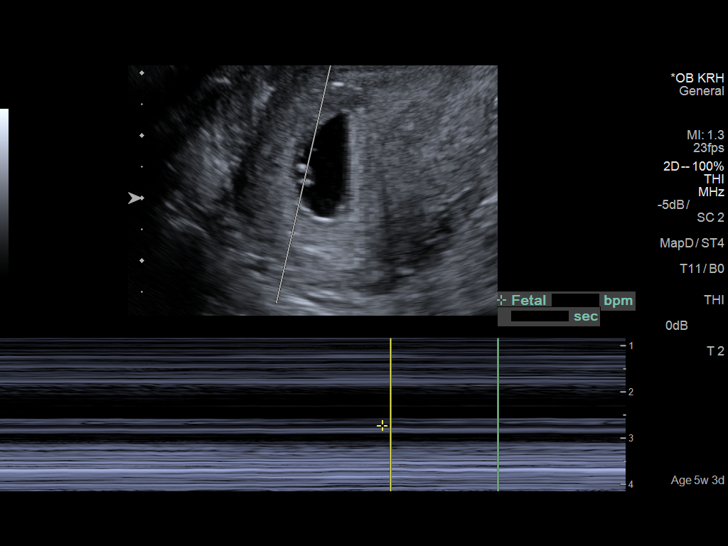
[im 19/33]
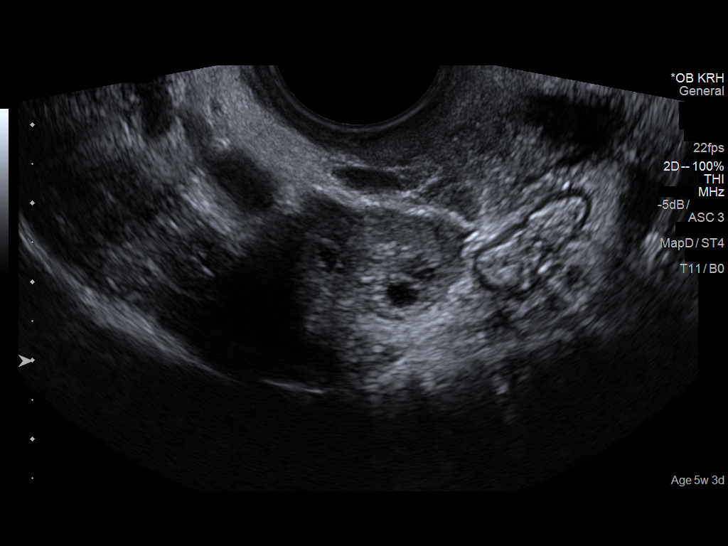
[im 22/33]
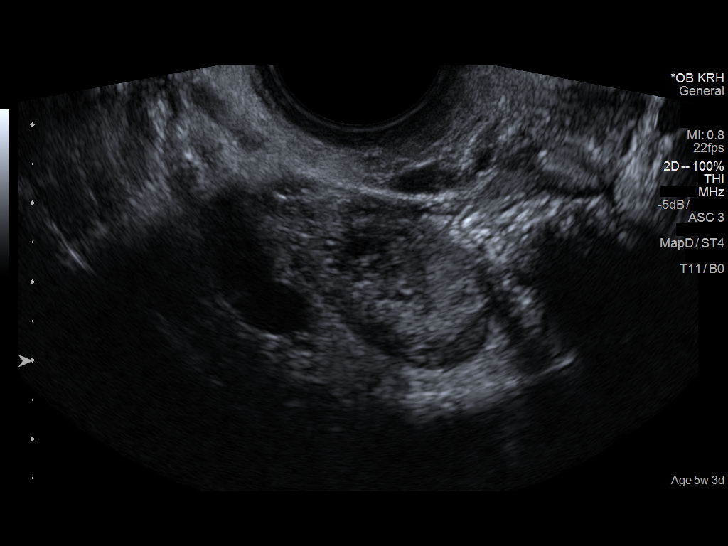
[im 24/33]
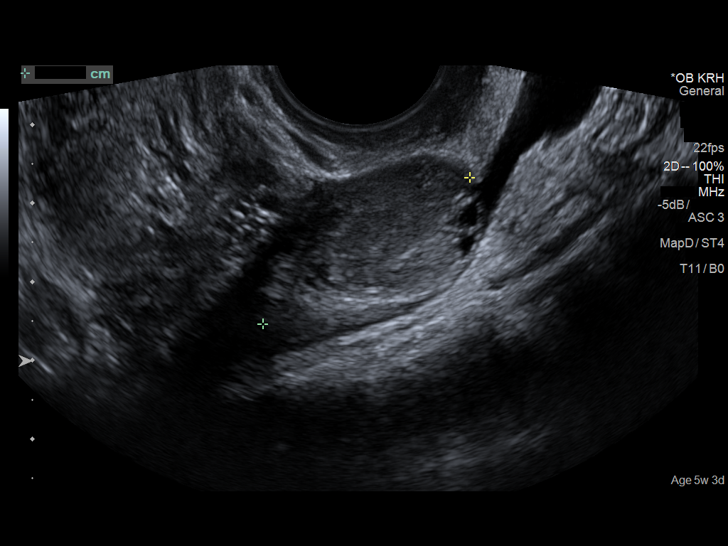
[im 27/33]
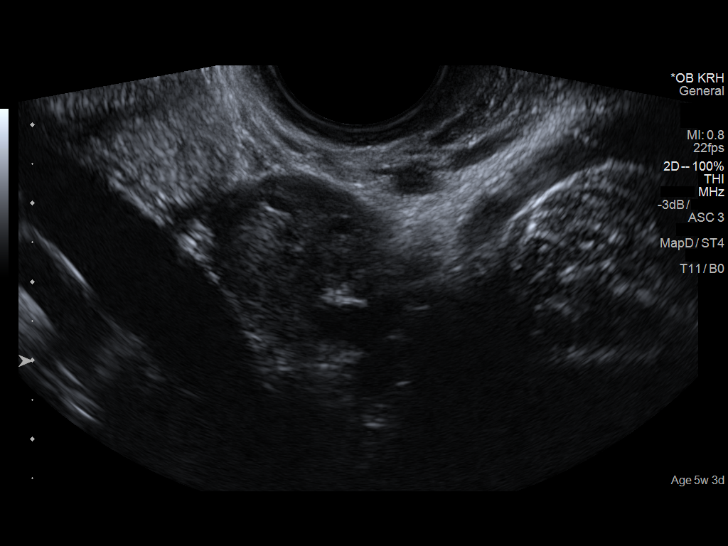
[im 29/33]
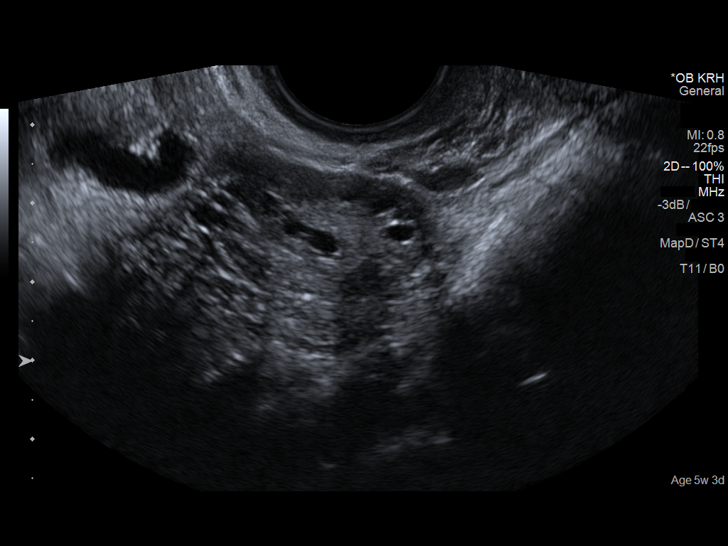
[im 31/33]
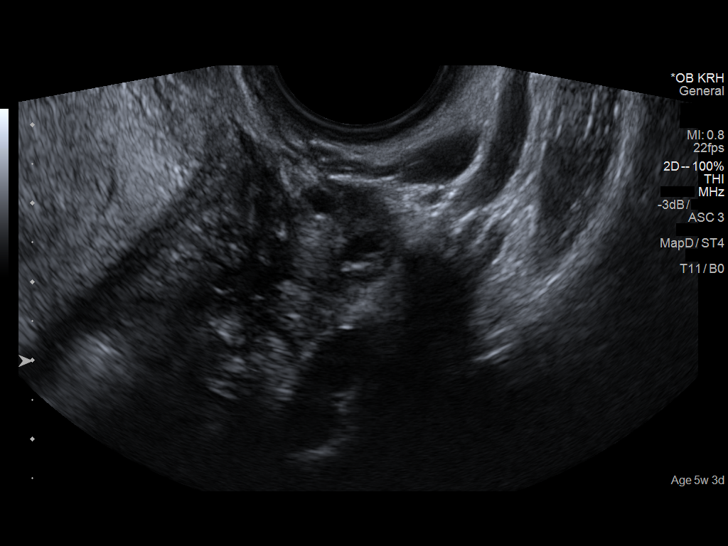

[13 of 28 positions shown; findings below may reference images not displayed]

FINDINGS: Intrauterine gestational sac: Single; visualized and normal in
shape.

Yolk sac:  Yes

Embryo:  Yes

Cardiac Activity: Yes

Heart Rate: 95  bpm

CRL:  4.5  mm   6 w   1 d                  US EDC: 08/01/2017

Subchorionic hemorrhage:  None visualized.

Maternal uterus/adnexae: The ovaries are unremarkable in appearance.
The right ovary measures 3.1 x 1.9 x 2.7 cm, while the left ovary
measures 3.4 x 3.2 x 1.8 cm. No suspicious adnexal masses are seen;
there is no evidence for ovarian torsion.

A small amount of free fluid is seen within the pelvic cul-de-sac.
IMPRESSION: Single live intrauterine pregnancy noted, with a crown-rump length
of 5 mm, corresponding to a gestational age of 6 weeks 1 day. This
matches the gestational age of 5 weeks 3 days by LMP, reflecting an
estimated date of delivery August 06, 2017.

## 2018-10-20 ENCOUNTER — Emergency Department (HOSPITAL_BASED_OUTPATIENT_CLINIC_OR_DEPARTMENT_OTHER)
Admission: EM | Admit: 2018-10-20 | Discharge: 2018-10-20 | Disposition: A | Discharge status: Home / Self Care | Payer: Self-pay | Attending: Emergency Medicine | Admitting: Emergency Medicine

## 2018-10-20 ENCOUNTER — Other Ambulatory Visit: Payer: Self-pay

## 2018-10-20 ENCOUNTER — Encounter (HOSPITAL_BASED_OUTPATIENT_CLINIC_OR_DEPARTMENT_OTHER): Payer: Self-pay | Admitting: Emergency Medicine

## 2018-10-20 DIAGNOSIS — Z79899 Other long term (current) drug therapy: Secondary | ICD-10-CM | POA: Insufficient documentation

## 2018-10-20 DIAGNOSIS — N760 Acute vaginitis: Secondary | ICD-10-CM | POA: Insufficient documentation

## 2018-10-20 DIAGNOSIS — B9689 Other specified bacterial agents as the cause of diseases classified elsewhere: Secondary | ICD-10-CM | POA: Insufficient documentation

## 2018-10-20 DIAGNOSIS — Z87891 Personal history of nicotine dependence: Secondary | ICD-10-CM | POA: Insufficient documentation

## 2018-10-20 LAB — WET PREP, GENITAL
Sperm: NONE SEEN
Trich, Wet Prep: NONE SEEN
Yeast Wet Prep HPF POC: NONE SEEN

## 2018-10-20 LAB — PREGNANCY, URINE: Preg Test, Ur: NEGATIVE

## 2018-10-20 MED ORDER — METRONIDAZOLE 500 MG PO TABS: 500.0000 mg | ORAL_TABLET | Freq: Two times a day (BID) | ORAL | 0 refills | Status: DC

## 2018-10-20 NOTE — ED Triage Notes (Signed)
Pt states she has a light discharge that is clear in color and has a light odor  Sxs started 2-3 days ago

## 2018-10-20 NOTE — ED Notes (Signed)
ED Provider at bedside. 

## 2018-10-20 NOTE — ED Notes (Signed)
Pt attempting to obtain urine specimen at this time.

## 2018-10-20 NOTE — Discharge Instructions (Addendum)
It is important to follow-up with an OB/GYN for Pap smear testing.  You will need repeat testing to evaluate your cervix.  Take the antibiotics until completed, even if you start feeling better.  Do not drink alcohol while on this medicine as it can make you violently ill.

## 2018-10-20 NOTE — ED Provider Notes (Signed)
[facility] EMERGENCY DEPARTMENT Provider Note   CSN: [id number] Arrival date & time: 10/20/18  2449    History   Chief Complaint Chief Complaint  Patient presents with  . Vaginal Discharge    HPI Julia Goodwin is a 23 y.o. female.     HPI  23 year old female presents with vaginal discharge.  Started a couple days ago.  It is light and slightly smelly.  She states that this is earlier than she typically comes in and wanted to catch it early.  She is had both BV but also yeast infection.  Feels like the last time she was here.  She tried some leftover antibiotic from a previous time but states it did not seem to help.  No itching, vaginal pain, abdominal pain, or urinary symptoms.  She has unprotected intercourse with her boyfriend.  Past Medical History:  Diagnosis Date  . Headache   . Heart murmur     There are no active problems to display for this patient.   History reviewed. No pertinent surgical history.   OB History    Gravida  1   Para      Term      Preterm      AB      Living        SAB      TAB      Ectopic      Multiple      Live Births               Home Medications    Prior to Admission medications   Medication Sig Start Date End Date Taking? Authorizing Provider  famotidine (PEPCID) 20 MG tablet Take 1 tablet (20 mg total) by mouth 2 (two) times daily. 03/14/18   Law, Waylan Boga, PA-C  metoCLOPramide (REGLAN) 10 MG tablet Take 1 tablet (10 mg total) by mouth every 6 (six) hours. 03/14/18   Law, Waylan Boga, PA-C  metroNIDAZOLE (FLAGYL) 500 MG tablet Take 1 tablet (500 mg total) by mouth 2 (two) times daily. One po bid x 7 days 10/20/18   Pricilla Loveless, MD  ondansetron (ZOFRAN ODT) 8 MG disintegrating tablet Take 1 tablet (8 mg total) by mouth every 8 (eight) hours as needed for nausea or vomiting. 03/09/18   Molpus, Jonny Ruiz, MD  potassium chloride SA (K-DUR,KLOR-CON) 20 MEQ tablet Take 1 tablet (20 mEq total) by mouth  daily for 3 doses. 03/14/18 03/17/18  Emi Holes, PA-C  sucralfate (CARAFATE) 1 GM/10ML suspension Take 10 mLs (1 g total) by mouth 4 (four) times daily -  with meals and at bedtime. 03/14/18   Emi Holes, PA-C    Family History Family History  Problem Relation Age of Onset  . Hypertension Other     Social History Social History   Tobacco Use  . Smoking status: Former Smoker    Types: Cigarettes  . Smokeless tobacco: Never Used  Substance Use Topics  . Alcohol use: Yes    Comment: occ  . Drug use: Yes    Types: Marijuana     Allergies   Patient has no known allergies.   Review of Systems Review of Systems  Constitutional: Negative for fever.  Gastrointestinal: Negative for abdominal pain and vomiting.  Genitourinary: Positive for vaginal discharge. Negative for dysuria.  All other systems reviewed and are negative.    Physical Exam Updated Vital Signs BP 116/69 (BP Location: Left Arm)   Pulse 71   Temp  98.4 F (36.9 C) (Oral)   Resp 16   Ht 4\' 11"  (1.499 m)   Wt 48.3 kg   SpO2 100%   BMI 21.50 kg/m   Physical Exam Vitals signs and nursing note reviewed.  Constitutional:      General: She is not in acute distress.    Appearance: She is well-developed. She is not ill-appearing, toxic-appearing or diaphoretic.  HENT:     Head: Normocephalic and atraumatic.     Right Ear: External ear normal.     Left Ear: External ear normal.     Nose: Nose normal.  Eyes:     General:        Right eye: No discharge.        Left eye: No discharge.  Cardiovascular:     Rate and Rhythm: Normal rate and regular rhythm.     Heart sounds: Normal heart sounds.  Pulmonary:     Effort: Pulmonary effort is normal.     Breath sounds: Normal breath sounds.  Abdominal:     Palpations: Abdomen is soft.     Tenderness: There is no abdominal tenderness.  Genitourinary:    Vagina: Vaginal discharge (mild) present.     Cervix: Discharge (mild) present.     Uterus: Not  enlarged and not tender.      Skin:    General: Skin is warm and dry.  Neurological:     Mental Status: She is alert.  Psychiatric:        Mood and Affect: Mood is not anxious.      ED Treatments / Results  Labs (all labs ordered are listed, but only abnormal results are displayed) Labs Reviewed  WET PREP, GENITAL - Abnormal; Notable for the following components:      Result Value   Clue Cells Wet Prep HPF POC PRESENT (*)    WBC, Wet Prep HPF POC MANY (*)    All other components within normal limits  PREGNANCY, URINE  GC/CHLAMYDIA PROBE AMP ([place]) NOT AT [facility]    EKG None  Radiology No results found.  Procedures Procedures (including critical care time)  Medications Ordered in ED Medications - No data to display   Initial Impression / Assessment and Plan / ED Course  I have reviewed the triage vital signs and the nursing notes.  Pertinent labs & imaging results that were available during my care of the patient were reviewed by me and considered in my medical decision making (see chart for details).        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  She has had many negative prior GC/Chlamydia's though she has been gonorrhea positive a few years ago.  She prefers to wait as she has low suspicion for STI.  Will treat with Flagyl.  I discussed the redness/lesion to her cervix and the need for a Pap smear by OB/GYN.  She verbalized understanding.  Final Clinical Impressions(s) / ED Diagnoses   Final diagnoses:  BV (bacterial vaginosis)    ED Discharge Orders         Ordered    metroNIDAZOLE (FLAGYL) 500 MG tablet  2 times daily     10/20/18 0754           Pricilla Loveless, MD 10/20/18 [phone]

## 2018-10-22 LAB — GC/CHLAMYDIA PROBE AMP (~~LOC~~) NOT AT ARMC
Chlamydia: NEGATIVE
Neisseria Gonorrhea: NEGATIVE

## 2018-12-21 ENCOUNTER — Encounter (HOSPITAL_BASED_OUTPATIENT_CLINIC_OR_DEPARTMENT_OTHER): Payer: Self-pay | Admitting: Emergency Medicine

## 2018-12-21 ENCOUNTER — Other Ambulatory Visit: Payer: Self-pay

## 2018-12-21 ENCOUNTER — Emergency Department (HOSPITAL_BASED_OUTPATIENT_CLINIC_OR_DEPARTMENT_OTHER)
Admission: EM | Admit: 2018-12-21 | Discharge: 2018-12-21 | Disposition: A | Discharge status: Home / Self Care | Payer: Self-pay | Attending: Emergency Medicine | Admitting: Emergency Medicine

## 2018-12-21 DIAGNOSIS — Z79899 Other long term (current) drug therapy: Secondary | ICD-10-CM | POA: Insufficient documentation

## 2018-12-21 DIAGNOSIS — E860 Dehydration: Secondary | ICD-10-CM | POA: Insufficient documentation

## 2018-12-21 DIAGNOSIS — Z87891 Personal history of nicotine dependence: Secondary | ICD-10-CM | POA: Insufficient documentation

## 2018-12-21 DIAGNOSIS — R112 Nausea with vomiting, unspecified: Secondary | ICD-10-CM | POA: Insufficient documentation

## 2018-12-21 LAB — URINALYSIS, ROUTINE W REFLEX MICROSCOPIC
Glucose, UA: NEGATIVE mg/dL
Ketones, ur: >80 mg/dL — AB
Nitrite: NEGATIVE
Protein, ur: 30 mg/dL — AB
Specific Gravity, Urine: >1.03 — ABNORMAL HIGH (ref 1.005–1.030)
pH: 6 (ref 5.0–8.0)

## 2018-12-21 LAB — URINALYSIS, MICROSCOPIC (REFLEX)

## 2018-12-21 LAB — BASIC METABOLIC PANEL
Anion gap: 12 (ref 5–15)
BUN: 13 mg/dL (ref 6–20)
CO2: 22 mmol/L (ref 22–32)
Calcium: 10.2 mg/dL (ref 8.9–10.3)
Chloride: 105 mmol/L (ref 98–111)
Creatinine, Ser: 0.81 mg/dL (ref 0.44–1.00)
GFR calc Af Amer: >60 mL/min (ref >60)
GFR calc non Af Amer: >60 mL/min (ref >60)
Glucose, Bld: 125 mg/dL — ABNORMAL HIGH (ref 70–99)
Potassium: 3.7 mmol/L (ref 3.5–5.1)
Sodium: 139 mmol/L (ref 135–145)

## 2018-12-21 LAB — CBC WITH DIFFERENTIAL/PLATELET
Abs Immature Granulocytes: 0.05 10*3/uL (ref 0.00–0.07)
Basophils Absolute: 0 10*3/uL (ref 0.0–0.1)
Basophils Relative: 0 %
Eosinophils Absolute: 0 10*3/uL (ref 0.0–0.5)
Eosinophils Relative: 0 %
HCT: 42.2 % (ref 36.0–46.0)
Hemoglobin: 14.2 g/dL (ref 12.0–15.0)
Immature Granulocytes: 0 %
Lymphocytes Relative: 8 %
Lymphs Abs: 1.2 10*3/uL (ref 0.7–4.0)
MCH: 33.2 pg (ref 26.0–34.0)
MCHC: 33.6 g/dL (ref 30.0–36.0)
MCV: 98.6 fL (ref 80.0–100.0)
Monocytes Absolute: 0.3 10*3/uL (ref 0.1–1.0)
Monocytes Relative: 2 %
Neutro Abs: 13.4 10*3/uL — ABNORMAL HIGH (ref 1.7–7.7)
Neutrophils Relative %: 90 %
Platelets: 188 10*3/uL (ref 150–400)
RBC: 4.28 MIL/uL (ref 3.87–5.11)
RDW: 11.9 % (ref 11.5–15.5)
WBC: 15 10*3/uL — ABNORMAL HIGH (ref 4.0–10.5)
nRBC: 0 % (ref 0.0–0.2)

## 2018-12-21 LAB — PREGNANCY, URINE: Preg Test, Ur: NEGATIVE

## 2018-12-21 MED ORDER — METOCLOPRAMIDE HCL 10 MG PO TABS: 10.0000 mg | ORAL_TABLET | Freq: Four times a day (QID) | ORAL | 0 refills | Status: DC | PRN

## 2018-12-21 MED ORDER — METOCLOPRAMIDE HCL 5 MG/ML IJ SOLN
10.0000 mg | Freq: Once | INTRAMUSCULAR | Status: AC
  Administered 2018-12-21: 02:00:00 10 mg via INTRAVENOUS
  Filled 2018-12-21: qty 2

## 2018-12-21 MED ORDER — SODIUM CHLORIDE 0.9 % IV BOLUS
1000.0000 mL | Freq: Once | INTRAVENOUS | Status: AC
  Administered 2018-12-21: 1000 mL via INTRAVENOUS

## 2018-12-21 NOTE — ED Provider Notes (Signed)
[facility] Provider Note: Julia Dell, MD, FACEP  CSN: [id number] MRN: [id number] ARRIVAL: 12/21/18 at 0150 ROOM: [facility]   CHIEF COMPLAINT  Vomiting   HISTORY OF PRESENT ILLNESS  12/21/18 2:05 AM Julia Goodwin is a 23 y.o. female who complains of nausea and vomiting since yesterday morning about 8 AM.  She states "I have been throwing up all day".  She states she was not able to keep anything down until she started sucking on lemon earlier this morning which is helped ease her stomach.  She denies fever, diarrhea and abdominal pain.  She is concerned she may be dehydrated, as this has happened in the past, and has a dry mouth.   Past Medical History:  Diagnosis Date  . Headache   . Heart murmur     History reviewed. No pertinent surgical history.  Family History  Problem Relation Age of Onset  . Hypertension Other     Social History   Tobacco Use  . Smoking status: Former Smoker    Types: Cigarettes  . Smokeless tobacco: Never Used  Substance Use Topics  . Alcohol use: Yes    Comment: occ  . Drug use: Yes    Types: Marijuana    Prior to Admission medications   Medication Sig Start Date End Date Taking? Authorizing Provider  famotidine (PEPCID) 20 MG tablet Take 1 tablet (20 mg total) by mouth 2 (two) times daily. 03/14/18   Law, Waylan Boga, PA-C  metoCLOPramide (REGLAN) 10 MG tablet Take 1 tablet (10 mg total) by mouth every 6 (six) hours. 03/14/18   Law, Waylan Boga, PA-C  metroNIDAZOLE (FLAGYL) 500 MG tablet Take 1 tablet (500 mg total) by mouth 2 (two) times daily. One po bid x 7 days 10/20/18   Pricilla Loveless, MD  ondansetron (ZOFRAN ODT) 8 MG disintegrating tablet Take 1 tablet (8 mg total) by mouth every 8 (eight) hours as needed for nausea or vomiting. 03/09/18   Maloni Musleh, Jonny Ruiz, MD  potassium chloride SA (K-DUR,KLOR-CON) 20 MEQ tablet Take 1 tablet (20 mEq total) by mouth daily for 3 doses. 03/14/18 03/17/18  Emi Holes, PA-C   sucralfate (CARAFATE) 1 GM/10ML suspension Take 10 mLs (1 g total) by mouth 4 (four) times daily -  with meals and at bedtime. 03/14/18   Emi Holes, PA-C    Allergies Patient has no known allergies.   REVIEW OF SYSTEMS  Negative except as noted here or in the History of Present Illness.   PHYSICAL EXAMINATION  Initial Vital Signs Blood pressure 114/76, pulse 68, temperature 98.3 F (36.8 C), temperature source Oral, resp. rate 16, height  (1.499 m), weight 48.5 kg, SpO2 100 %, unknown if currently breastfeeding.  Examination General: Well-developed, well-nourished female in no acute distress; appearance consistent with age of record HENT: normocephalic; atraumatic Eyes: Normal appearance Neck: supple Heart: regular rate and rhythm; no murmur Lungs: clear to auscultation bilaterally Abdomen: soft; nondistended; nontender; no masses or hepatosplenomegaly; bowel sounds present Extremities: No deformity; full range of motion; pulses normal Neurologic: Awake, alert and oriented; motor function intact in all extremities and symmetric; no facial droop Skin: Warm and dry Psychiatric: Normal mood and affect   RESULTS  Summary of this visit's results, reviewed by myself:   EKG Interpretation  Date/Time:    Ventricular Rate:    PR Interval:    QRS Duration:   QT Interval:    QTC Calculation:   R Axis:     Text  Interpretation:        Laboratory Studies: Results for orders placed or performed during the hospital encounter of 12/21/18 (from the past 24 hour(s))  Urinalysis, Routine w reflex microscopic     Status: Abnormal   Collection Time: 12/21/18  2:06 AM  Result Value Ref Range   Color, Urine ORANGE (A) YELLOW   APPearance CLOUDY (A) CLEAR   Specific Gravity, Urine >1.030 (H) 1.005 - 1.030   pH 6.0 5.0 - 8.0   Glucose, UA NEGATIVE NEGATIVE mg/dL   Hgb urine dipstick LARGE (A) NEGATIVE   Bilirubin Urine SMALL (A) NEGATIVE   Ketones, ur >80 (A) NEGATIVE  mg/dL   Protein, ur 30 (A) NEGATIVE mg/dL   Nitrite NEGATIVE NEGATIVE   Leukocytes,Ua TRACE (A) NEGATIVE  Pregnancy, urine     Status: None   Collection Time: 12/21/18  2:06 AM  Result Value Ref Range   Preg Test, Ur NEGATIVE NEGATIVE  Urinalysis, Microscopic (reflex)     Status: Abnormal   Collection Time: 12/21/18  2:06 AM  Result Value Ref Range   RBC / HPF 11-20 0 - 5 RBC/hpf   WBC, UA 6-10 0 - 5 WBC/hpf   Bacteria, UA MANY (A) NONE SEEN   Squamous Epithelial / LPF 21-50 0 - 5   Mucus PRESENT   CBC with Differential/Platelet     Status: Abnormal   Collection Time: 12/21/18  2:07 AM  Result Value Ref Range   WBC 15.0 (H) 4.0 - 10.5 K/uL   RBC 4.28 3.87 - 5.11 MIL/uL   Hemoglobin 14.2 12.0 - 15.0 g/dL   HCT 81.1 03.1 - 59.4 %   MCV 98.6 80.0 - 100.0 fL   MCH 33.2 26.0 - 34.0 pg   MCHC 33.6 30.0 - 36.0 g/dL   RDW 58.5 92.9 - 24.4 %   Platelets 188 150 - 400 K/uL   nRBC 0.0 0.0 - 0.2 %   Neutrophils Relative % 90 %   Neutro Abs 13.4 (H) 1.7 - 7.7 K/uL   Lymphocytes Relative 8 %   Lymphs Abs 1.2 0.7 - 4.0 K/uL   Monocytes Relative 2 %   Monocytes Absolute 0.3 0.1 - 1.0 K/uL   Eosinophils Relative 0 %   Eosinophils Absolute 0.0 0.0 - 0.5 K/uL   Basophils Relative 0 %   Basophils Absolute 0.0 0.0 - 0.1 K/uL   Immature Granulocytes 0 %   Abs Immature Granulocytes 0.05 0.00 - 0.07 K/uL  Basic metabolic panel     Status: Abnormal   Collection Time: 12/21/18  2:07 AM  Result Value Ref Range   Sodium 139 135 - 145 mmol/L   Potassium 3.7 3.5 - 5.1 mmol/L   Chloride 105 98 - 111 mmol/L   CO2 22 22 - 32 mmol/L   Glucose, Bld 125 (H) 70 - 99 mg/dL   BUN 13 6 - 20 mg/dL   Creatinine, Ser 6.28 0.44 - 1.00 mg/dL   Calcium 63.8 8.9 - 17.7 mg/dL   GFR calc non Af Amer >60 >60 mL/min   GFR calc Af Amer >60 >60 mL/min   Anion gap 12 5 - 15   Imaging Studies: No results found.  ED COURSE and MDM  Nursing notes and initial vitals signs, including pulse oximetry, reviewed.   Vitals:   12/21/18 0201 12/21/18 0209  BP:  114/76  Pulse:  68  Resp:  16  Temp:  98.3 F (36.8 C)  TempSrc:  Oral  SpO2:  100%  Weight: 48.5 kg   Height: 4\' 11"  (1.499 m)    3:15 AM Patient tolerating fluids without emesis after normal saline bolus and Reglan.  Urine sent for culture.  Although the urinalysis is suggestive of a urinary tract infection she has a history of similar looking urinalyses in the past with negative cultures.  I do not believe treatment with an antibiotic is indicated at this time.  PROCEDURES    ED DIAGNOSES     ICD-10-CM   1. Nausea and vomiting in adult R11.2   2. Dehydration E86.0        Valori Hollenkamp, Jonny RuizJohn, MD 12/21/18 [phone]

## 2018-12-21 NOTE — ED Notes (Signed)
Pt able to tolerate fluids no signs of nausea or vomiting. Pt report feeling much better at this time.

## 2018-12-21 NOTE — ED Notes (Signed)
PO challenge started

## 2018-12-21 NOTE — ED Triage Notes (Signed)
Vomiting since 0800. No diarrhea. Denies pain.

## 2018-12-22 ENCOUNTER — Other Ambulatory Visit: Payer: Self-pay

## 2018-12-22 ENCOUNTER — Encounter (HOSPITAL_BASED_OUTPATIENT_CLINIC_OR_DEPARTMENT_OTHER): Payer: Self-pay | Admitting: Emergency Medicine

## 2018-12-22 ENCOUNTER — Emergency Department (HOSPITAL_BASED_OUTPATIENT_CLINIC_OR_DEPARTMENT_OTHER)
Admission: EM | Admit: 2018-12-22 | Discharge: 2018-12-22 | Disposition: A | Discharge status: Home / Self Care | Payer: Self-pay | Attending: Emergency Medicine | Admitting: Emergency Medicine

## 2018-12-22 DIAGNOSIS — Z87891 Personal history of nicotine dependence: Secondary | ICD-10-CM | POA: Insufficient documentation

## 2018-12-22 DIAGNOSIS — R112 Nausea with vomiting, unspecified: Secondary | ICD-10-CM | POA: Insufficient documentation

## 2018-12-22 LAB — CBC WITH DIFFERENTIAL/PLATELET
Abs Immature Granulocytes: 0.03 10*3/uL (ref 0.00–0.07)
Basophils Absolute: 0 10*3/uL (ref 0.0–0.1)
Basophils Relative: 0 %
Eosinophils Absolute: 0.1 10*3/uL (ref 0.0–0.5)
Eosinophils Relative: 1 %
HCT: 40.9 % (ref 36.0–46.0)
Hemoglobin: 13.5 g/dL (ref 12.0–15.0)
Immature Granulocytes: 0 %
Lymphocytes Relative: 32 %
Lymphs Abs: 3.6 10*3/uL (ref 0.7–4.0)
MCH: 32.5 pg (ref 26.0–34.0)
MCHC: 33 g/dL (ref 30.0–36.0)
MCV: 98.3 fL (ref 80.0–100.0)
Monocytes Absolute: 0.8 10*3/uL (ref 0.1–1.0)
Monocytes Relative: 7 %
Neutro Abs: 6.8 10*3/uL (ref 1.7–7.7)
Neutrophils Relative %: 60 %
Platelets: 183 10*3/uL (ref 150–400)
RBC: 4.16 MIL/uL (ref 3.87–5.11)
RDW: 12 % (ref 11.5–15.5)
WBC: 11.3 10*3/uL — ABNORMAL HIGH (ref 4.0–10.5)
nRBC: 0 % (ref 0.0–0.2)

## 2018-12-22 LAB — COMPREHENSIVE METABOLIC PANEL
ALT: 15 U/L (ref 0–44)
AST: 23 U/L (ref 15–41)
Albumin: 4.8 g/dL (ref 3.5–5.0)
Alkaline Phosphatase: 55 U/L (ref 38–126)
Anion gap: 14 (ref 5–15)
BUN: 11 mg/dL (ref 6–20)
CO2: 19 mmol/L — ABNORMAL LOW (ref 22–32)
Calcium: 9.6 mg/dL (ref 8.9–10.3)
Chloride: 107 mmol/L (ref 98–111)
Creatinine, Ser: 0.72 mg/dL (ref 0.44–1.00)
GFR calc Af Amer: >60 mL/min (ref >60)
GFR calc non Af Amer: >60 mL/min (ref >60)
Glucose, Bld: 103 mg/dL — ABNORMAL HIGH (ref 70–99)
Potassium: 3.3 mmol/L — ABNORMAL LOW (ref 3.5–5.1)
Sodium: 140 mmol/L (ref 135–145)
Total Bilirubin: 0.9 mg/dL (ref 0.3–1.2)
Total Protein: 7.6 g/dL (ref 6.5–8.1)

## 2018-12-22 LAB — URINE CULTURE: Culture: NO GROWTH

## 2018-12-22 MED ORDER — SODIUM CHLORIDE 0.9 % IV BOLUS
1000.0000 mL | Freq: Once | INTRAVENOUS | Status: AC
  Administered 2018-12-22: 1000 mL via INTRAVENOUS

## 2018-12-22 MED ORDER — METOCLOPRAMIDE HCL 5 MG/ML IJ SOLN
10.0000 mg | Freq: Once | INTRAMUSCULAR | Status: AC
  Administered 2018-12-22: 07:00:00 10 mg via INTRAVENOUS
  Filled 2018-12-22: qty 2

## 2018-12-22 NOTE — ED Notes (Signed)
Pt verbalized understanding of dc instructions.

## 2018-12-22 NOTE — ED Provider Notes (Signed)
[facility] EMERGENCY DEPARTMENT Provider Note   CSN: [id number] Arrival date & time: 12/22/18  2423    History   Chief Complaint Chief Complaint  Patient presents with  . Emesis    HPI Julia Goodwin is a 23 y.o. female.  She is complaining of nausea and vomiting since 2 days ago.  She was seen yesterday for same and improved with some IV fluids and Reglan.  She said since she went home she tried to eat some food but was unable to hold her down and has continued to vomit.  She said her stomach feels sore from all the vomiting.  No fevers or chills no diarrhea no urinary symptoms.  She said this is been going on and off for a few years.  She is tried some Pedialyte without any improvement.     The history is provided by the patient.  Emesis  Severity:  Severe Duration:  2 days Timing:  Constant Quality:  Stomach contents Progression:  Unchanged Chronicity:  Recurrent Context: not post-tussive and not self-induced   Relieved by:  Nothing Worsened by:  Nothing Ineffective treatments:  Antiemetics Associated symptoms: abdominal pain   Associated symptoms: no cough, no diarrhea, no fever, no headaches and no sore throat     Past Medical History:  Diagnosis Date  . Headache   . Heart murmur     There are no active problems to display for this patient.   History reviewed. No pertinent surgical history.   OB History    Gravida  1   Para      Term      Preterm      AB      Living        SAB      TAB      Ectopic      Multiple      Live Births               Home Medications    Prior to Admission medications   Medication Sig Start Date End Date Taking? Authorizing Provider  metoCLOPramide (REGLAN) 10 MG tablet Take 1 tablet (10 mg total) by mouth every 6 (six) hours as needed for nausea or vomiting. 12/21/18   Molpus, John, MD    Family History Family History  Problem Relation Age of Onset  . Hypertension Other     Social  History Social History   Tobacco Use  . Smoking status: Former Smoker    Types: Cigarettes  . Smokeless tobacco: Never Used  Substance Use Topics  . Alcohol use: Yes    Comment: occ  . Drug use: Yes    Types: Marijuana     Allergies   Patient has no known allergies.   Review of Systems Review of Systems  Constitutional: Positive for fatigue. Negative for fever.  HENT: Negative for sore throat.   Eyes: Negative for visual disturbance.  Respiratory: Negative for cough and shortness of breath.   Cardiovascular: Negative for chest pain.  Gastrointestinal: Positive for abdominal pain and vomiting. Negative for diarrhea.  Genitourinary: Negative for dysuria.  Musculoskeletal: Negative for neck pain.  Skin: Negative for rash.  Neurological: Negative for headaches.     Physical Exam Updated Vital Signs BP 122/72 (BP Location: Left Arm)   Pulse (!) 54   Temp 98.5 F (36.9 C) (Oral)   Resp 20   SpO2 100%   Physical Exam Vitals signs and nursing note reviewed.  Constitutional:  General: She is not in acute distress.    Appearance: She is well-developed.  HENT:     Head: Normocephalic and atraumatic.  Eyes:     Conjunctiva/sclera: Conjunctivae normal.  Neck:     Musculoskeletal: Neck supple.  Cardiovascular:     Rate and Rhythm: Normal rate and regular rhythm.     Heart sounds: No murmur.  Pulmonary:     Effort: Pulmonary effort is normal. No respiratory distress.     Breath sounds: Normal breath sounds.  Abdominal:     Palpations: Abdomen is soft.     Tenderness: There is no abdominal tenderness.  Musculoskeletal: Normal range of motion.  Skin:    General: Skin is warm and dry.     Capillary Refill: Capillary refill takes less than 2 seconds.  Neurological:     General: No focal deficit present.     Mental Status: She is alert and oriented to person, place, and time.      ED Treatments / Results  Labs (all labs ordered are listed, but only abnormal  results are displayed) Labs Reviewed  COMPREHENSIVE METABOLIC PANEL - Abnormal; Notable for the following components:      Result Value   Potassium 3.3 (*)    CO2 19 (*)    Glucose, Bld 103 (*)    All other components within normal limits  CBC WITH DIFFERENTIAL/PLATELET - Abnormal; Notable for the following components:   WBC 11.3 (*)    All other components within normal limits    EKG None  Radiology No results found.  Procedures Procedures (including critical care time)  Medications Ordered in ED Medications  metoCLOPramide (REGLAN) injection 10 mg (has no administration in time range)  sodium chloride 0.9 % bolus 1,000 mL (has no administration in time range)     Initial Impression / Assessment and Plan / ED Course  I have reviewed the triage vital signs and the nursing notes.  Pertinent labs & imaging results that were available during my care of the patient were reviewed by me and considered in my medical decision making (see chart for details).  Clinical Course as of Dec 22 843  Sat Dec 22, 2018  [id number] Patient received fluids and medication is feeling better.  She is tolerated some p.o. here.  We talked about some antacid medication but she is afraid to put anything in her stomach that might make her nauseous.  We talked about a brat diet and she is going to attempt this.   [MB]  [id number] Differential diagnosis includes gastritis, gastroparesis, cannabis hyperemesis syndrome.  Patient was here yesterday and a negative pregnancy test.  Will not repeat that.  Potassium slightly low at 3.3.     [MB]    Clinical Course User Index [MB] Terrilee FilesButler,  C, MD       Final Clinical Impressions(s) / ED Diagnoses   Final diagnoses:  Non-intractable vomiting with nausea, unspecified vomiting type    ED Discharge Orders    None       Terrilee FilesButler,  C, MD 12/22/18 [phone]

## 2018-12-22 NOTE — ED Triage Notes (Signed)
Pt states she has been vomiting since Thursday  Pt was seen here on Friday  States she felt better when she left but started vomiting again last night and the medication she was prescribed did not help  Pt states she is unable to eat  Pt states she tried to drink pedialyte and some water but was unable to hold either of them down

## 2018-12-22 NOTE — Discharge Instructions (Addendum)
You were seen in the emergency department for continued nausea and vomiting.  Your symptoms were improved after some IV fluids and nausea medication.  You should continue to try to stay well-hydrated at home, and eat frequent small meals until your symptoms resolve.  Return if any concerns.

## 2019-03-02 ENCOUNTER — Encounter (HOSPITAL_BASED_OUTPATIENT_CLINIC_OR_DEPARTMENT_OTHER): Payer: Self-pay | Admitting: Emergency Medicine

## 2019-03-02 ENCOUNTER — Other Ambulatory Visit: Payer: Self-pay

## 2019-03-02 ENCOUNTER — Emergency Department (HOSPITAL_BASED_OUTPATIENT_CLINIC_OR_DEPARTMENT_OTHER)
Admission: EM | Admit: 2019-03-02 | Discharge: 2019-03-02 | Disposition: A | Discharge status: Home / Self Care | Payer: Self-pay | Attending: Emergency Medicine | Admitting: Emergency Medicine

## 2019-03-02 DIAGNOSIS — Z3202 Encounter for pregnancy test, result negative: Secondary | ICD-10-CM | POA: Insufficient documentation

## 2019-03-02 DIAGNOSIS — Z87891 Personal history of nicotine dependence: Secondary | ICD-10-CM | POA: Insufficient documentation

## 2019-03-02 DIAGNOSIS — R1110 Vomiting, unspecified: Secondary | ICD-10-CM | POA: Insufficient documentation

## 2019-03-02 DIAGNOSIS — R1084 Generalized abdominal pain: Secondary | ICD-10-CM | POA: Insufficient documentation

## 2019-03-02 LAB — COMPREHENSIVE METABOLIC PANEL
ALT: 18 U/L (ref 0–44)
AST: 20 U/L (ref 15–41)
Albumin: 5.4 g/dL — ABNORMAL HIGH (ref 3.5–5.0)
Alkaline Phosphatase: 55 U/L (ref 38–126)
Anion gap: 13 (ref 5–15)
BUN: 17 mg/dL (ref 6–20)
CO2: 22 mmol/L (ref 22–32)
Calcium: 9.9 mg/dL (ref 8.9–10.3)
Chloride: 102 mmol/L (ref 98–111)
Creatinine, Ser: 0.65 mg/dL (ref 0.44–1.00)
GFR calc Af Amer: >60 mL/min (ref >60)
GFR calc non Af Amer: >60 mL/min (ref >60)
Glucose, Bld: 117 mg/dL — ABNORMAL HIGH (ref 70–99)
Potassium: 3.3 mmol/L — ABNORMAL LOW (ref 3.5–5.1)
Sodium: 137 mmol/L (ref 135–145)
Total Bilirubin: 1 mg/dL (ref 0.3–1.2)
Total Protein: 8 g/dL (ref 6.5–8.1)

## 2019-03-02 LAB — CBC WITH DIFFERENTIAL/PLATELET
Abs Immature Granulocytes: 0.02 10*3/uL (ref 0.00–0.07)
Basophils Absolute: 0 10*3/uL (ref 0.0–0.1)
Basophils Relative: 0 %
Eosinophils Absolute: 0 10*3/uL (ref 0.0–0.5)
Eosinophils Relative: 0 %
HCT: 42.9 % (ref 36.0–46.0)
Hemoglobin: 14.3 g/dL (ref 12.0–15.0)
Immature Granulocytes: 0 %
Lymphocytes Relative: 14 %
Lymphs Abs: 1.5 10*3/uL (ref 0.7–4.0)
MCH: 32.8 pg (ref 26.0–34.0)
MCHC: 33.3 g/dL (ref 30.0–36.0)
MCV: 98.4 fL (ref 80.0–100.0)
Monocytes Absolute: 0.5 10*3/uL (ref 0.1–1.0)
Monocytes Relative: 5 %
Neutro Abs: 8.5 10*3/uL — ABNORMAL HIGH (ref 1.7–7.7)
Neutrophils Relative %: 81 %
Platelets: 200 10*3/uL (ref 150–400)
RBC: 4.36 MIL/uL (ref 3.87–5.11)
RDW: 12.6 % (ref 11.5–15.5)
WBC: 10.6 10*3/uL — ABNORMAL HIGH (ref 4.0–10.5)
nRBC: 0 % (ref 0.0–0.2)

## 2019-03-02 LAB — URINALYSIS, ROUTINE W REFLEX MICROSCOPIC
Glucose, UA: NEGATIVE mg/dL
Ketones, ur: >80 mg/dL — AB
Leukocytes,Ua: NEGATIVE
Nitrite: NEGATIVE
Protein, ur: 30 mg/dL — AB
Specific Gravity, Urine: 1.025 (ref 1.005–1.030)
pH: 6.5 (ref 5.0–8.0)

## 2019-03-02 LAB — URINALYSIS, MICROSCOPIC (REFLEX)

## 2019-03-02 LAB — LIPASE, BLOOD: Lipase: 23 U/L (ref 11–51)

## 2019-03-02 LAB — PREGNANCY, URINE: Preg Test, Ur: NEGATIVE

## 2019-03-02 MED ORDER — ONDANSETRON HCL 4 MG/2ML IJ SOLN
4.0000 mg | Freq: Once | INTRAMUSCULAR | Status: AC
  Administered 2019-03-02: 4 mg via INTRAVENOUS
  Filled 2019-03-02: qty 2

## 2019-03-02 MED ORDER — POTASSIUM CHLORIDE CRYS ER 20 MEQ PO TBCR: 40.0000 meq | EXTENDED_RELEASE_TABLET | Freq: Two times a day (BID) | ORAL | 0 refills | Status: AC

## 2019-03-02 MED ORDER — POTASSIUM CHLORIDE CRYS ER 20 MEQ PO TBCR
40.0000 meq | EXTENDED_RELEASE_TABLET | Freq: Once | ORAL | Status: AC
  Administered 2019-03-02: 40 meq via ORAL
  Filled 2019-03-02: qty 2

## 2019-03-02 MED ORDER — ONDANSETRON HCL 4 MG PO TABS: 4.0000 mg | ORAL_TABLET | Freq: Three times a day (TID) | ORAL | 0 refills | Status: AC | PRN

## 2019-03-02 MED ORDER — LACTATED RINGERS IV BOLUS
1000.0000 mL | Freq: Once | INTRAVENOUS | Status: AC
  Administered 2019-03-02: 03:00:00 1000 mL via INTRAVENOUS

## 2019-03-02 NOTE — ED Notes (Signed)
Pt given some saltine crackers at her request

## 2019-03-02 NOTE — ED Notes (Signed)
Pt asked for urine sample. Stated cannot not give at this time.

## 2019-03-02 NOTE — ED Notes (Signed)
Pt ambulated to restroom to try and obtain urine sample

## 2019-03-02 NOTE — ED Notes (Signed)
ED Provider at bedside. 

## 2019-03-02 NOTE — ED Triage Notes (Signed)
Pt states she is dehydrated and needs fluids

## 2019-03-02 NOTE — ED Notes (Signed)
Pt understood dc material. NAD noted. Scripts sent in electronically. All questions answered to satisfaction. Pt escorted to check out window 

## 2019-03-02 NOTE — ED Provider Notes (Signed)
Emergency Department Provider Note   I have reviewed the triage vital signs and the nursing notes.   HISTORY  Chief Complaint Emesis and Abdominal Pain   HPI Julia Goodwin is a 23 y.o. female who presents the emergency department today with 2 days of nonbloody nonbilious emesis and some crampy abdominal pain after she vomits.  No diarrhea or constipation.  No fevers.  No sick contacts.  No abnormal food intake.  She states dark urine but no dysuria, polyuria or malodorous urine.  Denies alcohol, drugs or tobacco.  States is having before she felt better with some fluids and IV medicines.   No other associated or modifying symptoms.    Past Medical History:  Diagnosis Date  . Headache   . Heart murmur     There are no active problems to display for this patient.   History reviewed. No pertinent surgical history.  Current Outpatient Rx  . Order #: [id number] Class: Print  . Order #: [id number] Class: Normal  . Order #: [id number] Class: Normal    Allergies Patient has no known allergies.  Family History  Problem Relation Age of Onset  . Hypertension Other     Social History Social History   Tobacco Use  . Smoking status: Former Smoker    Types: Cigarettes  . Smokeless tobacco: Never Used  Substance Use Topics  . Alcohol use: Yes    Comment: occ  . Drug use: Yes    Types: Marijuana    Review of Systems  All other systems negative except as documented in the HPI. All pertinent positives and negatives as reviewed in the HPI. ____________________________________________   PHYSICAL EXAM:  VITAL SIGNS: ED Triage Vitals  Enc Vitals Group     BP 03/02/19 0258 132/83     Pulse Rate 03/02/19 0258 66     Resp 03/02/19 0258 16     Temp 03/02/19 0258 98.5 F (36.9 C)     Temp Source 03/02/19 0258 Oral     SpO2 03/02/19 0258 100 %     Weight 03/02/19 0256 110 lb (49.9 kg)     Height 03/02/19 0256 5\' 1"  (1.549 m)     Head Circumference --      Peak Flow  --      Pain Score 03/02/19 0256 6     Pain Loc --      Pain Edu? --      Excl. in [place]? --     Constitutional: Alert and oriented. Well appearing and in no acute distress. Eyes: Conjunctivae are normal. PERRL. EOMI. Head: Atraumatic. Nose: No congestion/rhinnorhea. Mouth/Throat: Mucous membranes are dry.  Oropharynx non-erythematous. Neck: No stridor.  No meningeal signs.   Cardiovascular: Normal rate, regular rhythm. Good peripheral circulation. Grossly normal heart sounds.   Respiratory: Normal respiratory effort.  No retractions. Lungs CTAB. Gastrointestinal: Soft and nontender. No distention.  Musculoskeletal: No lower extremity tenderness nor edema. No gross deformities of extremities. Neurologic:  Normal speech and language. No gross focal neurologic deficits are appreciated.  Skin:  Skin is warm, dry and intact. No rash noted.   ____________________________________________   LABS (all labs ordered are listed, but only abnormal results are displayed)  Labs Reviewed  CBC WITH DIFFERENTIAL/PLATELET - Abnormal; Notable for the following components:      Result Value   WBC 10.6 (*)    Neutro Abs 8.5 (*)    All other components within normal limits  COMPREHENSIVE METABOLIC PANEL - Abnormal; Notable for the following  components:   Potassium 3.3 (*)    Glucose, Bld 117 (*)    Albumin 5.4 (*)    All other components within normal limits  URINALYSIS, ROUTINE W REFLEX MICROSCOPIC - Abnormal; Notable for the following components:   Color, Urine ORANGE (*)    APPearance CLOUDY (*)    Hgb urine dipstick LARGE (*)    Bilirubin Urine SMALL (*)    Ketones, ur >80 (*)    Protein, ur 30 (*)    All other components within normal limits  URINALYSIS, MICROSCOPIC (REFLEX) - Abnormal; Notable for the following components:   Bacteria, UA MANY (*)    All other components within normal limits  URINE CULTURE  LIPASE, BLOOD  PREGNANCY, URINE   ____________________________________________      INITIAL IMPRESSION / ASSESSMENT AND PLAN / ED COURSE  eval for dehydration/UTI while treating symptoms.  Workup unremarkable aside from low K. Treated for same. toleratign PO.   DC.     Pertinent labs & imaging results that were available during my care of the patient were reviewed by me and considered in my medical decision making (see chart for details).   A medical screening exam was performed and I feel the patient has had an appropriate workup for their chief complaint at this time and likelihood of emergent condition existing is low. They have been counseled on decision, discharge, follow up and which symptoms necessitate immediate return to the emergency department. They or their family verbally stated understanding and agreement with plan and discharged in stable condition.   ____________________________________________  FINAL CLINICAL IMPRESSION(S) / ED DIAGNOSES  Final diagnoses:  Generalized abdominal pain     MEDICATIONS GIVEN DURING THIS VISIT:  Medications  lactated ringers bolus 1,000 mL (0 mLs Intravenous Stopped 03/02/19 0423)  ondansetron (ZOFRAN) injection 4 mg (4 mg Intravenous Given 03/02/19 0321)  potassium chloride SA (K-DUR) CR tablet 40 mEq (40 mEq Oral Given 03/02/19 0423)     NEW OUTPATIENT MEDICATIONS STARTED DURING THIS VISIT:  Discharge Medication List as of 03/02/2019  5:48 AM    START taking these medications   Details  ondansetron (ZOFRAN) 4 MG tablet Take 1 tablet (4 mg total) by mouth every 8 (eight) hours as needed for nausea or vomiting., Starting Sat 03/02/2019, Normal    potassium chloride SA (K-DUR) 20 MEQ tablet Take 2 tablets (40 mEq total) by mouth 2 (two) times daily for 7 days., Starting Sat 03/02/2019, Until Sat 03/09/2019, Normal        Note:  This note was prepared with assistance of Dragon voice recognition software. Occasional wrong-word or sound-a-like substitutions may have occurred due to the inherent limitations of  voice recognition software.   Myosha Cuadras, Barbara CowerJason, MD 03/02/19 [phone]

## 2019-03-02 NOTE — ED Notes (Signed)
Pt asked again for urine sample and stated she could not pee currently

## 2019-03-02 NOTE — ED Triage Notes (Signed)
Pt states that since yesterday morning she has been vomiting. States vomiting all day. Abdomen is sore from her throwing up. No other symptoms

## 2019-03-02 NOTE — ED Notes (Signed)
Pt states still dont have to pee

## 2019-03-03 ENCOUNTER — Emergency Department (HOSPITAL_BASED_OUTPATIENT_CLINIC_OR_DEPARTMENT_OTHER)
Admission: EM | Admit: 2019-03-03 | Discharge: 2019-03-03 | Disposition: A | Discharge status: Home / Self Care | Payer: Self-pay | Attending: Emergency Medicine | Admitting: Emergency Medicine

## 2019-03-03 ENCOUNTER — Other Ambulatory Visit: Payer: Self-pay

## 2019-03-03 DIAGNOSIS — R011 Cardiac murmur, unspecified: Secondary | ICD-10-CM | POA: Insufficient documentation

## 2019-03-03 DIAGNOSIS — R1030 Lower abdominal pain, unspecified: Secondary | ICD-10-CM | POA: Insufficient documentation

## 2019-03-03 DIAGNOSIS — F1210 Cannabis abuse, uncomplicated: Secondary | ICD-10-CM | POA: Insufficient documentation

## 2019-03-03 DIAGNOSIS — R112 Nausea with vomiting, unspecified: Secondary | ICD-10-CM | POA: Insufficient documentation

## 2019-03-03 DIAGNOSIS — Z79899 Other long term (current) drug therapy: Secondary | ICD-10-CM | POA: Insufficient documentation

## 2019-03-03 LAB — COMPREHENSIVE METABOLIC PANEL
ALT: 13 U/L (ref 0–44)
AST: 16 U/L (ref 15–41)
Albumin: 4.9 g/dL (ref 3.5–5.0)
Alkaline Phosphatase: 52 U/L (ref 38–126)
Anion gap: 13 (ref 5–15)
BUN: 15 mg/dL (ref 6–20)
CO2: 23 mmol/L (ref 22–32)
Calcium: 9.7 mg/dL (ref 8.9–10.3)
Chloride: 102 mmol/L (ref 98–111)
Creatinine, Ser: 0.72 mg/dL (ref 0.44–1.00)
GFR calc Af Amer: >60 mL/min (ref >60)
GFR calc non Af Amer: >60 mL/min (ref >60)
Glucose, Bld: 79 mg/dL (ref 70–99)
Potassium: 3.8 mmol/L (ref 3.5–5.1)
Sodium: 138 mmol/L (ref 135–145)
Total Bilirubin: 1.2 mg/dL (ref 0.3–1.2)
Total Protein: 7.6 g/dL (ref 6.5–8.1)

## 2019-03-03 LAB — CBC WITH DIFFERENTIAL/PLATELET
Abs Immature Granulocytes: 0.03 10*3/uL (ref 0.00–0.07)
Basophils Absolute: 0 10*3/uL (ref 0.0–0.1)
Basophils Relative: 0 %
Eosinophils Absolute: 0 10*3/uL (ref 0.0–0.5)
Eosinophils Relative: 0 %
HCT: 42 % (ref 36.0–46.0)
Hemoglobin: 13.6 g/dL (ref 12.0–15.0)
Immature Granulocytes: 0 %
Lymphocytes Relative: 24 %
Lymphs Abs: 2.3 10*3/uL (ref 0.7–4.0)
MCH: 32.5 pg (ref 26.0–34.0)
MCHC: 32.4 g/dL (ref 30.0–36.0)
MCV: 100.2 fL — ABNORMAL HIGH (ref 80.0–100.0)
Monocytes Absolute: 0.6 10*3/uL (ref 0.1–1.0)
Monocytes Relative: 6 %
Neutro Abs: 6.3 10*3/uL (ref 1.7–7.7)
Neutrophils Relative %: 70 %
Platelets: 184 10*3/uL (ref 150–400)
RBC: 4.19 MIL/uL (ref 3.87–5.11)
RDW: 12.4 % (ref 11.5–15.5)
WBC: 9.3 10*3/uL (ref 4.0–10.5)
nRBC: 0 % (ref 0.0–0.2)

## 2019-03-03 LAB — URINALYSIS, ROUTINE W REFLEX MICROSCOPIC
Glucose, UA: NEGATIVE mg/dL
Ketones, ur: >80 mg/dL — AB
Leukocytes,Ua: NEGATIVE
Nitrite: NEGATIVE
Protein, ur: NEGATIVE mg/dL
Specific Gravity, Urine: >1.03 — ABNORMAL HIGH (ref 1.005–1.030)
pH: 6 (ref 5.0–8.0)

## 2019-03-03 LAB — LIPASE, BLOOD: Lipase: 21 U/L (ref 11–51)

## 2019-03-03 LAB — URINALYSIS, MICROSCOPIC (REFLEX): WBC, UA: NONE SEEN WBC/hpf (ref 0–5)

## 2019-03-03 LAB — PREGNANCY, URINE: Preg Test, Ur: NEGATIVE

## 2019-03-03 MED ORDER — ONDANSETRON HCL 4 MG/2ML IJ SOLN
4.0000 mg | Freq: Once | INTRAMUSCULAR | Status: AC
  Administered 2019-03-03: 4 mg via INTRAVENOUS
  Filled 2019-03-03: qty 2

## 2019-03-03 MED ORDER — SODIUM CHLORIDE 0.9 % IV BOLUS
1000.0000 mL | Freq: Once | INTRAVENOUS | Status: AC
  Administered 2019-03-03: 15:00:00 1000 mL via INTRAVENOUS

## 2019-03-03 MED ORDER — DICYCLOMINE HCL 20 MG PO TABS: 20.0000 mg | ORAL_TABLET | Freq: Two times a day (BID) | ORAL | 0 refills | Status: AC

## 2019-03-03 MED ORDER — SODIUM CHLORIDE 0.9 % IV BOLUS
1000.0000 mL | Freq: Once | INTRAVENOUS | Status: AC
  Administered 2019-03-03: 1000 mL via INTRAVENOUS

## 2019-03-03 NOTE — ED Notes (Signed)
Patient is resting comfortably. 

## 2019-03-03 NOTE — ED Provider Notes (Signed)
[facility] EMERGENCY DEPARTMENT Provider Note   CSN: [id number] Arrival date & time: 03/03/19  1253     History   Chief Complaint Chief Complaint  Patient presents with  . Emesis    HPI Julia Goodwin is a 23 y.o. female.     Patient with history of no previous abdominal surgeries presents the emergency department with complaint of nausea and vomiting for the past 3 to 4 days with associated lower abdominal pain.  Patient was seen in the emergency department early yesterday approximately 36 hours ago and her symptoms have persisted since then.  She has a history of similar episodes.  She denies heavy NSAID use.  She states that she had been drinking alcohol, several drinks, before her current symptoms started.  She is also a daily marijuana user, last use this morning.  Patient denies any fevers.  No chest pain or shortness of breath.  No urinary symptoms.     Past Medical History:  Diagnosis Date  . Headache   . Heart murmur     There are no active problems to display for this patient.   No past surgical history on file.   OB History    Gravida  1   Para      Term      Preterm      AB      Living        SAB      TAB      Ectopic      Multiple      Live Births               Home Medications    Prior to Admission medications   Medication Sig Start Date End Date Taking? Authorizing Provider  metoCLOPramide (REGLAN) 10 MG tablet Take 1 tablet (10 mg total) by mouth every 6 (six) hours as needed for nausea or vomiting. 12/21/18   Molpus, John, MD  ondansetron (ZOFRAN) 4 MG tablet Take 1 tablet (4 mg total) by mouth every 8 (eight) hours as needed for nausea or vomiting. 03/02/19   Mesner, Barbara CowerJason, MD  potassium chloride SA (K-DUR) 20 MEQ tablet Take 2 tablets (40 mEq total) by mouth 2 (two) times daily for 7 days. 03/02/19 03/09/19  Mesner, Barbara CowerJason, MD    Family History Family History  Problem Relation Age of Onset  . Hypertension Other      Social History Social History   Tobacco Use  . Smoking status: Former Smoker    Types: Cigarettes  . Smokeless tobacco: Never Used  Substance Use Topics  . Alcohol use: Yes    Comment: occ  . Drug use: Yes    Types: Marijuana     Allergies   Patient has no known allergies.   Review of Systems Review of Systems  Constitutional: Negative for fever.  HENT: Negative for rhinorrhea and sore throat.   Eyes: Negative for redness.  Respiratory: Negative for cough.   Cardiovascular: Negative for chest pain.  Gastrointestinal: Positive for abdominal pain, nausea and vomiting. Negative for diarrhea.  Genitourinary: Negative for dysuria.  Musculoskeletal: Negative for myalgias.  Skin: Negative for rash.  Neurological: Negative for headaches.     Physical Exam Updated Vital Signs BP 102/67 (BP Location: Left Arm)   Pulse 70   Temp 98.9 F (37.2 C) (Oral)   Resp 16   Ht 5\' 1"  (1.549 m)   Wt 49.9 kg   SpO2 100%   BMI 20.78  kg/m   Physical Exam Vitals signs and nursing note reviewed.  Constitutional:      Appearance: She is well-developed.  HENT:     Head: Normocephalic and atraumatic.     Mouth/Throat:     Mouth: Mucous membranes are moist.  Eyes:     General:        Right eye: No discharge.        Left eye: No discharge.     Conjunctiva/sclera: Conjunctivae normal.  Neck:     Musculoskeletal: Normal range of motion and neck supple.  Cardiovascular:     Rate and Rhythm: Normal rate and regular rhythm.     Heart sounds: Normal heart sounds.  Pulmonary:     Effort: Pulmonary effort is normal.     Breath sounds: Normal breath sounds.  Abdominal:     Palpations: Abdomen is soft.     Tenderness: There is abdominal tenderness. There is no guarding or rebound.     Comments: Mild generalized tenderness.  Skin:    General: Skin is warm and dry.  Neurological:     Mental Status: She is alert.      ED Treatments / Results  Labs (all labs ordered are  listed, but only abnormal results are displayed) Labs Reviewed  CBC WITH DIFFERENTIAL/PLATELET - Abnormal; Notable for the following components:      Result Value   MCV 100.2 (*)    All other components within normal limits  URINALYSIS, ROUTINE W REFLEX MICROSCOPIC - Abnormal; Notable for the following components:   Specific Gravity, Urine >1.030 (*)    Hgb urine dipstick SMALL (*)    Bilirubin Urine SMALL (*)    Ketones, ur >80 (*)    All other components within normal limits  URINALYSIS, MICROSCOPIC (REFLEX) - Abnormal; Notable for the following components:   Bacteria, UA RARE (*)    All other components within normal limits  COMPREHENSIVE METABOLIC PANEL  LIPASE, BLOOD  PREGNANCY, URINE    EKG None  Radiology No results found.  Procedures Procedures (including critical care time)  Medications Ordered in ED Medications  sodium chloride 0.9 % bolus 1,000 mL (0 mLs Intravenous Stopped 03/03/19 1540)  ondansetron (ZOFRAN) injection 4 mg (4 mg Intravenous Given 03/03/19 1436)  sodium chloride 0.9 % bolus 1,000 mL (0 mLs Intravenous Stopped 03/03/19 1645)     Initial Impression / Assessment and Plan / ED Course  I have reviewed the triage vital signs and the nursing notes.  Pertinent labs & imaging results that were available during my care of the patient were reviewed by me and considered in my medical decision making (see chart for details).        Patient seen and examined.  Will treat symptomatically with fluids and antiemetics.  I had a discussion with the patient in regards to cyclic vomiting in the setting of marijuana use.  Will reassess.  Vital signs reviewed and are as follows: BP 102/67 (BP Location: Left Arm)   Pulse 70   Temp 98.9 F (37.2 C) (Oral)   Resp 16   Ht 5\' 1"  (1.549 m)   Wt 49.9 kg   SpO2 100%   BMI 20.78 kg/m   Lab work is reassuring.  Patient is feeling better.  She is tolerating oral fluids.  Awaiting urine.  Anticipate discharge to  home if she continues to do well.   5:46 PM work-up shows any signs of dehydration.  Patient has been treated with 2 L normal saline.  She had Zofran prescribed at previous visit.  Will give Bentyl to use for abdominal spasm and cramping.  The patient was urged to return to the Emergency Department immediately with worsening of current symptoms, worsening abdominal pain, persistent vomiting, blood noted in stools, fever, or any other concerns. The patient verbalized understanding.    Final Clinical Impressions(s) / ED Diagnoses   Final diagnoses:  Non-intractable vomiting with nausea, unspecified vomiting type   Patient with abdominal pain. Vitals are stable, no fever. Labs reassuring with improved potassium today. Imaging not indicated as symptoms are controlled and no focal abdominal pain. Patient is tolerating PO's.  Possible cannabinoid hyperemesis.  Lungs are clear and no signs suggestive of PNA. Low concern for appendicitis, cholecystitis, pancreatitis, ruptured viscus, UTI, kidney stone, aortic dissection, aortic aneurysm or other emergent abdominal etiology. Supportive therapy indicated with return if symptoms worsen.    ED Discharge Orders         Ordered    dicyclomine (BENTYL) 20 MG tablet  2 times daily     03/03/19 1744           Renne CriglerGeiple, Randle Shatzer, PA-C 03/03/19 1747    Virgina Norfolkuratolo, Adam, DO 03/03/19 2342

## 2019-03-03 NOTE — Discharge Instructions (Signed)
Please read and follow all provided instructions.  Your diagnoses today include:  1. Non-intractable vomiting with nausea, unspecified vomiting type     Tests performed today include:  Blood counts and electrolytes - improved potassium today  Blood tests to check liver and kidney function  Blood tests to check pancreas function  Urine test to look for infection  Vital signs. See below for your results today.   Medications prescribed:   Bentyl - medication for intestinal cramps and spasms  Take any prescribed medications only as directed.  Home care instructions:   Follow any educational materials contained in this packet.   Avoid marijuana.  Your abdominal pain, nausea, vomiting, and diarrhea may be caused by a viral gastroenteritis also called 'stomach flu'. You should rest for the next several days. Keep drinking plenty of fluids and use the medicine for nausea as directed.    Drink clear liquids for the next 24 hours and introduce solid foods slowly after 24 hours using the b.r.a.t. diet (Bananas, Rice, Applesauce, Toast, Yogurt).    Follow-up instructions: Please follow-up with your primary care provider in the next 2 days for further evaluation of your symptoms. If you are not feeling better in 48 hours you may have a condition that is more serious and you need re-evaluation.   Return instructions:  SEEK IMMEDIATE MEDICAL ATTENTION IF:  If you have pain that does not go away or becomes severe   A temperature above 101F develops   Repeated vomiting occurs (multiple episodes)   If you have pain that becomes localized to portions of the abdomen. The right side could possibly be appendicitis. In an adult, the left lower portion of the abdomen could be colitis or diverticulitis.   Blood is being passed in stools or vomit (bright red or black tarry stools)   You develop chest pain, difficulty breathing, dizziness or fainting, or become confused, poorly responsive, or  inconsolable (young children)  If you have any other emergent concerns regarding your health  Additional Information: Abdominal (belly) pain can be caused by many things. Your caregiver performed an examination and possibly ordered blood/urine tests and imaging (CT scan, x-rays, ultrasound). Many cases can be observed and treated at home after initial evaluation in the emergency department. Even though you are being discharged home, abdominal pain can be unpredictable. Therefore, you need a repeated exam if your pain does not resolve, returns, or worsens. Most patients with abdominal pain don't have to be admitted to the hospital or have surgery, but serious problems like appendicitis and gallbladder attacks can start out as nonspecific pain. Many abdominal conditions cannot be diagnosed in one visit, so follow-up evaluations are very important.  Your vital signs today were: BP 102/67 (BP Location: Left Arm)    Pulse 70    Temp 98.9 F (37.2 C) (Oral)    Resp 16    Ht 5\' 1"  (1.549 m)    Wt 49.9 kg    SpO2 100%    BMI 20.78 kg/m  If your blood pressure (bp) was elevated above 135/85 this visit, please have this repeated by your doctor within one month. --------------

## 2019-03-03 NOTE — ED Triage Notes (Signed)
Pt states she has "been dehydrated". Pt states previously treated in this Ed on Friday for similar symptoms. Pt c/o emesis and abdominal pain verbalized as related to vomiting. Pt denies fever. Pt states decreased appetite, that eating and drinking exacerbates vomiting

## 2019-03-03 NOTE — ED Notes (Signed)
Pt provided ginger ale for PO challenge  

## 2019-03-04 MED FILL — ONDANSETRON HCL 4 MG TABLET: 4 | 4 days supply | Qty: 12 | Fill #0

## 2019-03-04 MED FILL — POTASSIUM CHLORIDE CRYS ER: 20 | 7 days supply | Qty: 28 | Fill #0

## 2019-03-05 LAB — URINE CULTURE: Culture: >=100000 — AB

## 2019-03-06 ENCOUNTER — Telehealth: Payer: Self-pay | Admitting: *Deleted

## 2019-03-06 NOTE — Telephone Encounter (Signed)
Post ED Visit - Positive Culture Follow-up  Culture report reviewed by antimicrobial stewardship pharmacist: [place] Team []  Elenor Quinones, Pharm.D. []  Heide Guile, Pharm.D., BCPS AQ-ID []  Parks Neptune, Pharm.D., BCPS []  Alycia Rossetti, Pharm.D., BCPS []  [place], Pharm.D., BCPS, AAHIVP []  Legrand Como, Pharm.D., BCPS, AAHIVP []  Salome Arnt, PharmD, BCPS []  Johnnette Gourd, PharmD, BCPS []  Hughes Better, PharmD, BCPS []  Leeroy Cha, PharmD []  Laqueta Linden, PharmD, BCPS []  Albertina Parr, PharmD  [place] Team []  Leodis Sias, PharmD []  Lindell Spar, PharmD []  Royetta Asal, PharmD []  Graylin Shiver, Rph []  Rema Fendt) Glennon Mac, PharmD []  Arlyn Dunning, PharmD []  Netta Cedars, PharmD []  Dia Sitter, PharmD []  Leone Haven, PharmD []  Gretta Arab, PharmD []  Theodis Shove, PharmD []  Peggyann Juba, PharmD []  Reuel Boom, PharmD Gorden Harms, PharmD  Urine culture reviewed, low concern for UTI and no further patient follow-up is required at this time.  Harlon Flor [facility] 03/06/2019, 12:27 PM

## 2019-03-13 ENCOUNTER — Encounter (HOSPITAL_BASED_OUTPATIENT_CLINIC_OR_DEPARTMENT_OTHER): Payer: Self-pay

## 2019-03-13 ENCOUNTER — Emergency Department (HOSPITAL_BASED_OUTPATIENT_CLINIC_OR_DEPARTMENT_OTHER)
Admission: EM | Admit: 2019-03-13 | Discharge: 2019-03-13 | Disposition: A | Discharge status: Home / Self Care | Payer: Self-pay | Attending: Emergency Medicine | Admitting: Emergency Medicine

## 2019-03-13 ENCOUNTER — Other Ambulatory Visit: Payer: Self-pay

## 2019-03-13 DIAGNOSIS — R1084 Generalized abdominal pain: Secondary | ICD-10-CM | POA: Insufficient documentation

## 2019-03-13 DIAGNOSIS — Z87891 Personal history of nicotine dependence: Secondary | ICD-10-CM | POA: Insufficient documentation

## 2019-03-13 DIAGNOSIS — F1210 Cannabis abuse, uncomplicated: Secondary | ICD-10-CM | POA: Insufficient documentation

## 2019-03-13 DIAGNOSIS — R112 Nausea with vomiting, unspecified: Secondary | ICD-10-CM | POA: Insufficient documentation

## 2019-03-13 LAB — COMPREHENSIVE METABOLIC PANEL
ALT: 11 U/L (ref 0–44)
AST: 17 U/L (ref 15–41)
Albumin: 5 g/dL (ref 3.5–5.0)
Alkaline Phosphatase: 52 U/L (ref 38–126)
Anion gap: 14 (ref 5–15)
BUN: 11 mg/dL (ref 6–20)
CO2: 23 mmol/L (ref 22–32)
Calcium: 9.8 mg/dL (ref 8.9–10.3)
Chloride: 101 mmol/L (ref 98–111)
Creatinine, Ser: 0.79 mg/dL (ref 0.44–1.00)
GFR calc Af Amer: >60 mL/min (ref >60)
GFR calc non Af Amer: >60 mL/min (ref >60)
Glucose, Bld: 100 mg/dL — ABNORMAL HIGH (ref 70–99)
Potassium: 3.3 mmol/L — ABNORMAL LOW (ref 3.5–5.1)
Sodium: 138 mmol/L (ref 135–145)
Total Bilirubin: 1 mg/dL (ref 0.3–1.2)
Total Protein: 7.6 g/dL (ref 6.5–8.1)

## 2019-03-13 LAB — CBC
HCT: 43.3 % (ref 36.0–46.0)
Hemoglobin: 14 g/dL (ref 12.0–15.0)
MCH: 32.4 pg (ref 26.0–34.0)
MCHC: 32.3 g/dL (ref 30.0–36.0)
MCV: 100.2 fL — ABNORMAL HIGH (ref 80.0–100.0)
Platelets: 204 10*3/uL (ref 150–400)
RBC: 4.32 MIL/uL (ref 3.87–5.11)
RDW: 12.5 % (ref 11.5–15.5)
WBC: 12.4 10*3/uL — ABNORMAL HIGH (ref 4.0–10.5)
nRBC: 0 % (ref 0.0–0.2)

## 2019-03-13 LAB — LIPASE, BLOOD: Lipase: 26 U/L (ref 11–51)

## 2019-03-13 LAB — HCG, SERUM, QUALITATIVE: Preg, Serum: NEGATIVE

## 2019-03-13 MED ORDER — SODIUM CHLORIDE 0.9 % IV BOLUS
1000.0000 mL | Freq: Once | INTRAVENOUS | Status: AC
  Administered 2019-03-13: 1000 mL via INTRAVENOUS

## 2019-03-13 MED ORDER — HALOPERIDOL LACTATE 5 MG/ML IJ SOLN
2.0000 mg | Freq: Once | INTRAMUSCULAR | Status: AC
  Administered 2019-03-13: 12:00:00 2 mg via INTRAVENOUS
  Filled 2019-03-13: qty 1

## 2019-03-13 NOTE — ED Triage Notes (Signed)
Pt c/o n/v x 2-3 days-hx of same-NAD-steady gait

## 2019-03-13 NOTE — ED Provider Notes (Signed)
[facility] Emergency Department Provider Note MRN:  [id number]  Arrival date & time: 03/13/19     Chief Complaint   Emesis   History of Present Illness   ERMELINDA Goodwin is a 23 y.o. year-old female with no pertinent past medical history presenting to the ED with chief complaint of emesis.  Patient explains that this has happened multiple times in the past, the past 2 or 3 days she has had persistent nausea, vomiting, unable to tolerate p.o.  Accompanied by mild diffuse abdominal pain.  Denies vaginal bleeding or discharge, no chest pain or shortness of breath.  Pain is constant, no exacerbating relieving factors.  Daily marijuana user.  Review of Systems  A complete 10 system review of systems was obtained and all systems are negative except as noted in the HPI and PMH.   Patient's Health History    Past Medical History:  Diagnosis Date  . Headache   . Heart murmur     History reviewed. No pertinent surgical history.  Family History  Problem Relation Age of Onset  . Hypertension Other     Social History   Socioeconomic History  . Marital status: Single    Spouse name: Not on file  . Number of children: Not on file  . Years of education: Not on file  . Highest education level: Not on file  Occupational History  . Not on file  Social Needs  . Financial resource strain: Not on file  . Food insecurity    Worry: Not on file    Inability: Not on file  . Transportation needs    Medical: Not on file    Non-medical: Not on file  Tobacco Use  . Smoking status: Former Smoker    Types: Cigarettes  . Smokeless tobacco: Never Used  Substance and Sexual Activity  . Alcohol use: Yes    Comment: occ  . Drug use: Yes    Types: Marijuana    Comment: last smoked 03/12/19  . Sexual activity: Yes    Birth control/protection: Injection  Lifestyle  . Physical activity    Days per week: Not on file    Minutes per session: Not on file  . Stress:  Not on file  Relationships  . Social Herbalist on phone: Not on file    Gets together: Not on file    Attends religious service: Not on file    Active member of club or organization: Not on file    Attends meetings of clubs or organizations: Not on file    Relationship status: Not on file  . Intimate partner violence    Fear of current or ex partner: Not on file    Emotionally abused: Not on file    Physically abused: Not on file    Forced sexual activity: Not on file  Other Topics Concern  . Not on file  Social History Narrative  . Not on file     Physical Exam  Vital Signs and Nursing Notes reviewed Vitals:   03/13/19 1124  BP: (!) 134/95  Pulse: 65  Resp: 15  Temp: 98.7 F (37.1 C)  SpO2: 100%    CONSTITUTIONAL: Well-appearing, NAD NEURO:  Alert and oriented x 3, no focal deficits EYES:  eyes equal and reactive ENT/NECK:  no LAD, no JVD CARDIO: Regular rate, well-perfused, normal S1 and S2 PULM:  CTAB no wheezing or rhonchi GI/GU:  normal bowel sounds, non-distended, non-tender MSK/SPINE:  No gross deformities, no edema SKIN:  no rash, atraumatic PSYCH:  Appropriate speech and behavior  Diagnostic and Interventional Summary    Labs Reviewed  CBC - Abnormal; Notable for the following components:      Result Value   WBC 12.4 (*)    MCV 100.2 (*)    All other components within normal limits  COMPREHENSIVE METABOLIC PANEL - Abnormal; Notable for the following components:   Potassium 3.3 (*)    Glucose, Bld 100 (*)    All other components within normal limits  LIPASE, BLOOD  HCG, SERUM, QUALITATIVE    No orders to display    Medications  sodium chloride 0.9 % bolus 1,000 mL (0 mLs Intravenous Stopped 03/13/19 1253)  haloperidol lactate (HALDOL) injection 2 mg (2 mg Intravenous Given 03/13/19 1159)     Procedures Critical Care  ED Course and Medical Decision Making  I have reviewed the triage vital signs and the nursing notes.  Pertinent  labs & imaging results that were available during my care of the patient were reviewed by me and considered in my medical decision making (see below for details).  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  She has a soft and nontender abdomen she has normal vital signs.  Labs are reassuring, she is feeling much better after fluids and Haldol, she is requesting discharge.  Advised 2 weeks of avoiding marijuana to see if her symptoms improve.  After the discussed management above, the patient was determined to be safe for discharge.  The patient was in agreement with this plan and all questions regarding their care were answered.  ED return precautions were discussed and the patient will return to the ED with any significant worsening of condition.  Julia SowMichael M. Pilar PlateBero, MD [facility] [email or web address]  Final Clinical Impressions(s) / ED Diagnoses     ICD-10-CM   1. Non-intractable vomiting with nausea, unspecified vomiting type  R11.2   2. Generalized abdominal pain  R10.84     ED Discharge Orders    None         Sabas SousBero, Johnnae Impastato M, MD 03/13/19 1255

## 2019-03-13 NOTE — Discharge Instructions (Addendum)
You were evaluated in the Emergency Department and after careful evaluation, we did not find any emergent condition requiring admission or further testing in the hospital.  Your symptoms might be related to marijuana use.  We recommend avoiding marijuana for the next 2 weeks to see if your symptoms improve.  Please return to the Emergency Department if you experience any worsening of your condition.  We encourage you to follow up with a primary care provider.  Thank you for allowing [place] to be a part of your care.

## 2021-03-05 ENCOUNTER — Encounter (HOSPITAL_BASED_OUTPATIENT_CLINIC_OR_DEPARTMENT_OTHER): Payer: Self-pay | Admitting: Pharmacy Technician

## 2021-03-05 ENCOUNTER — Emergency Department (HOSPITAL_BASED_OUTPATIENT_CLINIC_OR_DEPARTMENT_OTHER)
Admission: EM | Admit: 2021-03-05 | Discharge: 2021-03-05 | Disposition: A | Discharge status: Home / Self Care | Payer: Self-pay | Attending: Emergency Medicine | Admitting: Emergency Medicine

## 2021-03-05 ENCOUNTER — Other Ambulatory Visit: Payer: Self-pay

## 2021-03-05 ENCOUNTER — Other Ambulatory Visit (HOSPITAL_BASED_OUTPATIENT_CLINIC_OR_DEPARTMENT_OTHER): Payer: Self-pay

## 2021-03-05 DIAGNOSIS — B9689 Other specified bacterial agents as the cause of diseases classified elsewhere: Secondary | ICD-10-CM

## 2021-03-05 DIAGNOSIS — Z87891 Personal history of nicotine dependence: Secondary | ICD-10-CM | POA: Insufficient documentation

## 2021-03-05 DIAGNOSIS — Z3A00 Weeks of gestation of pregnancy not specified: Secondary | ICD-10-CM | POA: Insufficient documentation

## 2021-03-05 DIAGNOSIS — A599 Trichomoniasis, unspecified: Secondary | ICD-10-CM

## 2021-03-05 DIAGNOSIS — Z3491 Encounter for supervision of normal pregnancy, unspecified, first trimester: Secondary | ICD-10-CM

## 2021-03-05 DIAGNOSIS — O23591 Infection of other part of genital tract in pregnancy, first trimester: Secondary | ICD-10-CM | POA: Insufficient documentation

## 2021-03-05 LAB — WET PREP, GENITAL
Sperm: NONE SEEN
Yeast Wet Prep HPF POC: NONE SEEN

## 2021-03-05 LAB — PREGNANCY, URINE: Preg Test, Ur: POSITIVE — AB

## 2021-03-05 LAB — HIV ANTIBODY (ROUTINE TESTING W REFLEX): HIV Screen 4th Generation wRfx: NONREACTIVE

## 2021-03-05 MED ORDER — CEFTRIAXONE SODIUM 500 MG IJ SOLR
500.0000 mg | Freq: Once | INTRAMUSCULAR | Status: AC
  Administered 2021-03-05: 500 mg via INTRAMUSCULAR
  Filled 2021-03-05: qty 500

## 2021-03-05 MED ORDER — AZITHROMYCIN 250 MG PO TABS
1000.0000 mg | ORAL_TABLET | Freq: Once | ORAL | Status: AC
  Administered 2021-03-05: 1000 mg via ORAL
  Filled 2021-03-05: qty 4

## 2021-03-05 MED ORDER — LIDOCAINE HCL (PF) 1 % IJ SOLN
INTRAMUSCULAR | Status: AC
  Filled 2021-03-05: qty 5

## 2021-03-05 MED ORDER — METRONIDAZOLE 500 MG PO TABS
500.0000 mg | ORAL_TABLET | Freq: Two times a day (BID) | ORAL | 0 refills | Status: AC
  Filled 2021-03-05: qty 14, 7d supply, fill #0

## 2021-03-05 NOTE — ED Triage Notes (Signed)
Pt here with reports of vaginal discharge for 3 days. Denies burning/itching. Also states she took a home pregnancy test that came back positive. LMP 1 month ago.

## 2021-03-05 NOTE — ED Provider Notes (Signed)
[facility] EMERGENCY DEPARTMENT Provider Note   CSN: [id number] Arrival date & time: 03/05/21  1018     History Chief Complaint  Patient presents with   Vaginal Discharge   Possible Pregnancy    Julia Goodwin is a 25 y.o. female.  Patient is a G3, P0 with 2 elective abortions in the past who is presenting today with concern that she may be pregnant.  She is also complaining of 3 days of vaginal discharge that is white and somewhat thick with a mild odor.  She has not had any vaginal bleeding or urinary symptoms.  She has had 2 sexual partners over the last 1 month which she has used protection with one of them.  She has had some intermittent cramping in her abdomen and some lower back pain.  Last menstrual period was June 5.  She took a pregnancy test at home yesterday that was positive.  She has had no nausea vomiting, diarrhea or fever.  The history is provided by the patient.  Vaginal Discharge Quality:  Yellow and thick Severity:  Mild Onset quality:  Gradual Duration:  3 days Timing:  Constant Progression:  Unchanged Chronicity:  New Context: spontaneously   Possible Pregnancy      Past Medical History:  Diagnosis Date   Headache    Heart murmur     There are no problems to display for this patient.   History reviewed. No pertinent surgical history.   OB History     Gravida  1   Para      Term      Preterm      AB      Living         SAB      IAB      Ectopic      Multiple      Live Births              Family History  Problem Relation Age of Onset   Hypertension Other     Social History   Tobacco Use   Smoking status: Former    Types: Cigarettes   Smokeless tobacco: Never  Vaping Use   Vaping Use: Never used  Substance Use Topics   Alcohol use: Yes    Comment: occ   Drug use: Yes    Types: Marijuana    Comment: last smoked 03/12/19    Home Medications Prior to Admission medications   Medication Sig  Start Date End Date Taking? Authorizing Provider  dicyclomine (BENTYL) 20 MG tablet Take 1 tablet (20 mg total) by mouth 2 (two) times daily. 03/03/19   Renne Crigler, PA-C  metoCLOPramide (REGLAN) 10 MG tablet Take 1 tablet (10 mg total) by mouth every 6 (six) hours as needed for nausea or vomiting. 12/21/18   Molpus, John, MD  ondansetron (ZOFRAN) 4 MG tablet Take 1 tablet (4 mg total) by mouth every 8 (eight) hours as needed for nausea or vomiting. 03/02/19   Mesner, Barbara Cower, MD  potassium chloride SA (K-DUR) 20 MEQ tablet Take 2 tablets (40 mEq total) by mouth 2 (two) times daily for 7 days. 03/02/19 03/09/19  Mesner, Barbara Cower, MD    Allergies    Patient has no known allergies.  Review of Systems   Review of Systems  Genitourinary:  Positive for vaginal discharge.  All other systems reviewed and are negative.  Physical Exam Updated Vital Signs BP 112/63   Pulse 76   Temp 98.6 F (  37 C) (Oral)   Resp 18   SpO2 100%   Physical Exam Vitals and nursing note reviewed. Exam conducted with a chaperone present.  Constitutional:      General: She is not in acute distress.    Appearance: Normal appearance. She is well-developed.  HENT:     Head: Normocephalic and atraumatic.  Eyes:     Pupils: Pupils are equal, round, and reactive to light.  Cardiovascular:     Rate and Rhythm: Normal rate.  Pulmonary:     Effort: Pulmonary effort is normal. No respiratory distress.  Abdominal:     General: Bowel sounds are normal. There is no distension.     Palpations: Abdomen is soft.     Tenderness: There is no abdominal tenderness. There is no guarding or rebound.  Genitourinary:    Vagina: Normal.     Cervix: Discharge present. No cervical motion tenderness.     Uterus: Normal.      Adnexa: Right adnexa normal and left adnexa normal.  Musculoskeletal:        General: No tenderness. Normal range of motion.     Comments: No edema  Skin:    General: Skin is warm and dry.     Findings: No rash.   Neurological:     Mental Status: She is alert and oriented to person, place, and time.     Cranial Nerves: No cranial nerve deficit.  Psychiatric:        Behavior: Behavior normal.    ED Results / Procedures / Treatments   Labs (all labs ordered are listed, but only abnormal results are displayed) Labs Reviewed  PREGNANCY, URINE - Abnormal; Notable for the following components:      Result Value   Preg Test, Ur POSITIVE (*)    All other components within normal limits  WET PREP, GENITAL  HIV ANTIBODY (ROUTINE TESTING W REFLEX)  RPR  GC/CHLAMYDIA PROBE AMP ([place]) NOT AT [facility]    EKG None  Radiology No results found.  Procedures Procedures   Medications Ordered in ED Medications - No data to display  ED Course  I have reviewed the triage vital signs and the nursing notes.  Pertinent labs & imaging results that were available during my care of the patient were reviewed by me and considered in my medical decision making (see chart for details).    MDM Rules/Calculators/A&P                          Patient presenting today due to vaginal discharge and concern that she may be pregnant.  She has had no vaginal bleeding and last menses was June 5.  Patient has no significant abdominal discomfort at this time.  She does have discharge on exam.  Wet prep, GC chlamydia are pending.  Patient had HIV and RPR done as well.  Given patient's symptoms and complaints low suspicion for an ectopic pregnancy at this time patient will be approximately [redacted] weeks pregnant by dates.  12:40 PM Patient pelvic exam without significant tenderness.  No symptoms to suggest PID or adnexal mass.  Wet prep is positive for trichomonas, clue cells and white blood cells.  Patient will be treated with Rocephin, azithromycin and Flagyl.  She was given women's health follow-up.  MDM   Amount and/or Complexity of Data Reviewed Clinical lab tests: ordered and reviewed Independent visualization of  images, tracings, or specimens: yes     Final Clinical  Impression(s) / ED Diagnoses Final diagnoses:  BV (bacterial vaginosis)  Trichomonas infection  First trimester pregnancy    Rx / DC Orders ED Discharge Orders          Ordered    metroNIDAZOLE (FLAGYL) 500 MG tablet  2 times daily        03/05/21 1238             Gwyneth Sprout, MD 03/05/21 1241

## 2021-03-05 NOTE — ED Notes (Signed)
Patient Alert and oriented to baseline. Stable and ambulatory to baseline. Patient verbalized understanding of the discharge instructions.  Patient belongings were taken by the patient.   

## 2021-03-05 NOTE — Discharge Instructions (Addendum)
The gonorrhea and Chlamydia testing will return tomorrow but you have already been treated.  Also the HIV and syphilis testing will return tomorrow.  If anything is positive someone will contact you about treatment.

## 2021-03-06 LAB — RPR: RPR Ser Ql: NONREACTIVE

## 2021-03-08 LAB — GC/CHLAMYDIA PROBE AMP (~~LOC~~) NOT AT ARMC
Chlamydia: POSITIVE — AB
Comment: NEGATIVE
Comment: NORMAL
Neisseria Gonorrhea: POSITIVE — AB

## 2021-03-31 ENCOUNTER — Other Ambulatory Visit: Payer: Self-pay

## 2021-03-31 ENCOUNTER — Emergency Department (HOSPITAL_BASED_OUTPATIENT_CLINIC_OR_DEPARTMENT_OTHER)
Admission: EM | Admit: 2021-03-31 | Discharge: 2021-04-01 | Disposition: A | Discharge status: Home / Self Care | Payer: Self-pay | Attending: Emergency Medicine | Admitting: Emergency Medicine

## 2021-03-31 ENCOUNTER — Encounter (HOSPITAL_BASED_OUTPATIENT_CLINIC_OR_DEPARTMENT_OTHER): Payer: Self-pay | Admitting: *Deleted

## 2021-03-31 DIAGNOSIS — Z87891 Personal history of nicotine dependence: Secondary | ICD-10-CM | POA: Insufficient documentation

## 2021-03-31 DIAGNOSIS — R112 Nausea with vomiting, unspecified: Secondary | ICD-10-CM | POA: Insufficient documentation

## 2021-03-31 HISTORY — DX: Cannabis abuse, uncomplicated: F12.10

## 2021-03-31 LAB — CBC WITH DIFFERENTIAL/PLATELET
Abs Immature Granulocytes: 0.04 10*3/uL (ref 0.00–0.07)
Basophils Absolute: 0 10*3/uL (ref 0.0–0.1)
Basophils Relative: 0 %
Eosinophils Absolute: 0.1 10*3/uL (ref 0.0–0.5)
Eosinophils Relative: 1 %
HCT: 37.4 % (ref 36.0–46.0)
Hemoglobin: 12.8 g/dL (ref 12.0–15.0)
Immature Granulocytes: 0 %
Lymphocytes Relative: 18 %
Lymphs Abs: 1.7 10*3/uL (ref 0.7–4.0)
MCH: 33.7 pg (ref 26.0–34.0)
MCHC: 34.2 g/dL (ref 30.0–36.0)
MCV: 98.4 fL (ref 80.0–100.0)
Monocytes Absolute: 0.6 10*3/uL (ref 0.1–1.0)
Monocytes Relative: 6 %
Neutro Abs: 7.1 10*3/uL (ref 1.7–7.7)
Neutrophils Relative %: 75 %
Platelets: 224 10*3/uL (ref 150–400)
RBC: 3.8 MIL/uL — ABNORMAL LOW (ref 3.87–5.11)
RDW: 12.8 % (ref 11.5–15.5)
WBC: 9.5 10*3/uL (ref 4.0–10.5)
nRBC: 0 % (ref 0.0–0.2)

## 2021-03-31 LAB — BASIC METABOLIC PANEL
Anion gap: 12 (ref 5–15)
BUN: 11 mg/dL (ref 6–20)
CO2: 23 mmol/L (ref 22–32)
Calcium: 9.6 mg/dL (ref 8.9–10.3)
Chloride: 104 mmol/L (ref 98–111)
Creatinine, Ser: 0.71 mg/dL (ref 0.44–1.00)
GFR, Estimated: >60 mL/min (ref >60)
Glucose, Bld: 133 mg/dL — ABNORMAL HIGH (ref 70–99)
Potassium: 3.2 mmol/L — ABNORMAL LOW (ref 3.5–5.1)
Sodium: 139 mmol/L (ref 135–145)

## 2021-03-31 MED ORDER — DIPHENHYDRAMINE HCL 50 MG/ML IJ SOLN
25.0000 mg | Freq: Once | INTRAMUSCULAR | Status: AC
  Administered 2021-03-31: 25 mg via INTRAVENOUS
  Filled 2021-03-31: qty 1

## 2021-03-31 MED ORDER — SODIUM CHLORIDE 0.9 % IV BOLUS
1000.0000 mL | Freq: Once | INTRAVENOUS | Status: AC
  Administered 2021-03-31: 1000 mL via INTRAVENOUS

## 2021-03-31 MED ORDER — POTASSIUM CHLORIDE CRYS ER 20 MEQ PO TBCR
40.0000 meq | EXTENDED_RELEASE_TABLET | Freq: Once | ORAL | Status: AC
  Administered 2021-03-31: 40 meq via ORAL
  Filled 2021-03-31: qty 2

## 2021-03-31 MED ORDER — METOCLOPRAMIDE HCL 10 MG PO TABS: 10.0000 mg | ORAL_TABLET | Freq: Three times a day (TID) | ORAL | 0 refills | Status: AC

## 2021-03-31 MED ORDER — METOCLOPRAMIDE HCL 5 MG/ML IJ SOLN
10.0000 mg | Freq: Once | INTRAMUSCULAR | Status: AC
  Administered 2021-03-31: 10 mg via INTRAVENOUS
  Filled 2021-03-31: qty 2

## 2021-03-31 NOTE — ED Provider Notes (Signed)
[facility] EMERGENCY DEPARTMENT Provider Note   CSN: [id number] Arrival date & time: 03/31/21  2135     History Chief Complaint  Patient presents with   Vomiting    Julia Goodwin is a 25 y.o. female.  Pt reports she has vomitting and cramping after drinking alcohol yesterday and smoking marijuana  Pt denies fever or chills,  Pt reports she is not pregnant.    The history is provided by the patient. No language interpreter was used.  Emesis Severity:  Severe Duration:  1 day Timing:  Constant Number of daily episodes:  Multiple Quality:  Unable to specify Progression:  Worsening Chronicity:  New Relieved by:  Nothing Worsened by:  Nothing Ineffective treatments:  None tried Associated symptoms: no abdominal pain   Risk factors: alcohol use   Risk factors: not pregnant       Past Medical History:  Diagnosis Date   Headache    Heart murmur    Marihuana abuse     There are no problems to display for this patient.   No past surgical history on file.   OB History     Gravida  1   Para      Term      Preterm      AB      Living         SAB      IAB      Ectopic      Multiple      Live Births              Family History  Problem Relation Age of Onset   Hypertension Other     Social History   Tobacco Use   Smoking status: Former    Types: Cigarettes   Smokeless tobacco: Never  Vaping Use   Vaping Use: Never used  Substance Use Topics   Alcohol use: Yes    Comment: occ   Drug use: Yes    Types: Marijuana    Comment: last smoked 03/12/19    Home Medications Prior to Admission medications   Medication Sig Start Date End Date Taking? Authorizing Provider  dicyclomine (BENTYL) 20 MG tablet Take 1 tablet (20 mg total) by mouth 2 (two) times daily. 03/03/19   Renne Crigler, PA-C  metoCLOPramide (REGLAN) 10 MG tablet Take 1 tablet (10 mg total) by mouth every 6 (six) hours as needed for nausea or vomiting. 12/21/18    Molpus, John, MD  metroNIDAZOLE (FLAGYL) 500 MG tablet Take 1 tablet (500 mg total) by mouth 2 (two) times daily. 03/05/21   Gwyneth Sprout, MD  ondansetron (ZOFRAN) 4 MG tablet Take 1 tablet (4 mg total) by mouth every 8 (eight) hours as needed for nausea or vomiting. 03/02/19   Mesner, Barbara Cower, MD  potassium chloride SA (K-DUR) 20 MEQ tablet Take 2 tablets (40 mEq total) by mouth 2 (two) times daily for 7 days. 03/02/19 03/09/19  Mesner, Barbara Cower, MD    Allergies    Patient has no known allergies.  Review of Systems   Review of Systems  Gastrointestinal:  Positive for vomiting. Negative for abdominal pain.  All other systems reviewed and are negative.  Physical Exam Updated Vital Signs BP 125/82 (BP Location: Right Arm)   Pulse 72   Temp 98.3 F (36.8 C) (Oral)   Resp 18   Ht 5\' 1"  (1.549 m)   Wt 54.4 kg   LMP 03/29/2021   SpO2 100%  BMI 22.67 kg/m   Physical Exam Vitals and nursing note reviewed.  Constitutional:      Appearance: She is well-developed.  HENT:     Head: Normocephalic.  Cardiovascular:     Rate and Rhythm: Normal rate.  Pulmonary:     Effort: Pulmonary effort is normal.  Abdominal:     General: Abdomen is flat. There is no distension.     Palpations: Abdomen is soft.  Musculoskeletal:        General: Normal range of motion.     Cervical back: Normal range of motion.  Skin:    General: Skin is warm.  Neurological:     Mental Status: She is alert and oriented to person, place, and time.  Psychiatric:        Mood and Affect: Mood normal.    ED Results / Procedures / Treatments   Labs (all labs ordered are listed, but only abnormal results are displayed) Labs Reviewed  CBC WITH DIFFERENTIAL/PLATELET - Abnormal; Notable for the following components:      Result Value   RBC 3.80 (*)    All other components within normal limits  BASIC METABOLIC PANEL - Abnormal; Notable for the following components:   Potassium 3.2 (*)    Glucose, Bld 133 (*)     All other components within normal limits  URINALYSIS, ROUTINE W REFLEX MICROSCOPIC  PREGNANCY, URINE  RAPID URINE DRUG SCREEN, HOSP PERFORMED    EKG None  Radiology No results found.  Procedures Procedures   Medications Ordered in ED Medications  potassium chloride SA (KLOR-CON) CR tablet 40 mEq (has no administration in time range)  sodium chloride 0.9 % bolus 1,000 mL (1,000 mLs Intravenous New Bag/Given 03/31/21 2227)  metoCLOPramide (REGLAN) injection 10 mg (10 mg Intravenous Given 03/31/21 2226)  diphenhydrAMINE (BENADRYL) injection 25 mg (25 mg Intravenous Given 03/31/21 2226)    ED Course  I have reviewed the triage vital signs and the nursing notes.  Pertinent labs & imaging results that were available during my care of the patient were reviewed by me and considered in my medical decision making (see chart for details).    MDM Rules/Calculators/A&P                           MDM;  pt feeling much better after iv fluids and reglan.  Pt able to tolerate fluids.  Pt given rx for reglan  Final Clinical Impression(s) / ED Diagnoses Final diagnoses:  Nausea and vomiting, intractability of vomiting not specified, unspecified vomiting type    Rx / DC Orders ED Discharge Orders          Ordered    metoCLOPramide (REGLAN) 10 MG tablet  3 times daily with meals        03/31/21 2331          An After Visit Summary was printed and given to the patient.    Osie Cheeks 03/31/21 2332    Cheryll Cockayne, MD 04/03/21 [phone]

## 2021-03-31 NOTE — Discharge Instructions (Addendum)
Return if any problems.

## 2021-03-31 NOTE — ED Notes (Signed)
Patient states taking zofran ODT, but "threw it up".

## 2021-03-31 NOTE — ED Notes (Signed)
Patient provided water for PO challenge

## 2021-03-31 NOTE — ED Triage Notes (Signed)
C/o vomiting x 1 day HX of same , admits to marijuana use

## 2021-04-02 ENCOUNTER — Other Ambulatory Visit (HOSPITAL_BASED_OUTPATIENT_CLINIC_OR_DEPARTMENT_OTHER): Payer: Self-pay

## 2021-04-02 ENCOUNTER — Encounter (HOSPITAL_BASED_OUTPATIENT_CLINIC_OR_DEPARTMENT_OTHER): Payer: Self-pay

## 2021-04-02 ENCOUNTER — Emergency Department (HOSPITAL_BASED_OUTPATIENT_CLINIC_OR_DEPARTMENT_OTHER)
Admission: EM | Admit: 2021-04-02 | Discharge: 2021-04-02 | Disposition: A | Discharge status: Home / Self Care | Payer: Self-pay | Attending: Emergency Medicine | Admitting: Emergency Medicine

## 2021-04-02 ENCOUNTER — Other Ambulatory Visit: Payer: Self-pay

## 2021-04-02 DIAGNOSIS — K529 Noninfective gastroenteritis and colitis, unspecified: Secondary | ICD-10-CM | POA: Insufficient documentation

## 2021-04-02 DIAGNOSIS — N3001 Acute cystitis with hematuria: Secondary | ICD-10-CM | POA: Insufficient documentation

## 2021-04-02 DIAGNOSIS — Z87891 Personal history of nicotine dependence: Secondary | ICD-10-CM | POA: Insufficient documentation

## 2021-04-02 DIAGNOSIS — B9689 Other specified bacterial agents as the cause of diseases classified elsewhere: Secondary | ICD-10-CM | POA: Insufficient documentation

## 2021-04-02 DIAGNOSIS — Z79899 Other long term (current) drug therapy: Secondary | ICD-10-CM | POA: Insufficient documentation

## 2021-04-02 LAB — URINALYSIS, MICROSCOPIC (REFLEX)

## 2021-04-02 LAB — URINALYSIS, ROUTINE W REFLEX MICROSCOPIC
Glucose, UA: NEGATIVE mg/dL
Ketones, ur: >80 mg/dL — AB
Leukocytes,Ua: NEGATIVE
Nitrite: POSITIVE — AB
Protein, ur: 30 mg/dL — AB
Specific Gravity, Urine: >1.03 — ABNORMAL HIGH (ref 1.005–1.030)
pH: 6 (ref 5.0–8.0)

## 2021-04-02 LAB — CBC
HCT: 38.3 % (ref 36.0–46.0)
Hemoglobin: 12.8 g/dL (ref 12.0–15.0)
MCH: 33.2 pg (ref 26.0–34.0)
MCHC: 33.4 g/dL (ref 30.0–36.0)
MCV: 99.5 fL (ref 80.0–100.0)
Platelets: 225 10*3/uL (ref 150–400)
RBC: 3.85 MIL/uL — ABNORMAL LOW (ref 3.87–5.11)
RDW: 12.9 % (ref 11.5–15.5)
WBC: 9 10*3/uL (ref 4.0–10.5)
nRBC: 0 % (ref 0.0–0.2)

## 2021-04-02 LAB — COMPREHENSIVE METABOLIC PANEL
ALT: 16 U/L (ref 0–44)
AST: 24 U/L (ref 15–41)
Albumin: 4.9 g/dL (ref 3.5–5.0)
Alkaline Phosphatase: 51 U/L (ref 38–126)
Anion gap: 12 (ref 5–15)
BUN: 9 mg/dL (ref 6–20)
CO2: 23 mmol/L (ref 22–32)
Calcium: 9.5 mg/dL (ref 8.9–10.3)
Chloride: 102 mmol/L (ref 98–111)
Creatinine, Ser: 0.73 mg/dL (ref 0.44–1.00)
GFR, Estimated: >60 mL/min (ref >60)
Glucose, Bld: 128 mg/dL — ABNORMAL HIGH (ref 70–99)
Potassium: 3.4 mmol/L — ABNORMAL LOW (ref 3.5–5.1)
Sodium: 137 mmol/L (ref 135–145)
Total Bilirubin: 0.7 mg/dL (ref 0.3–1.2)
Total Protein: 7.6 g/dL (ref 6.5–8.1)

## 2021-04-02 LAB — RAPID URINE DRUG SCREEN, HOSP PERFORMED
Amphetamines: NOT DETECTED
Barbiturates: NOT DETECTED
Benzodiazepines: NOT DETECTED
Cocaine: NOT DETECTED
Opiates: NOT DETECTED
Tetrahydrocannabinol: POSITIVE — AB

## 2021-04-02 LAB — LIPASE, BLOOD: Lipase: 20 U/L (ref 11–51)

## 2021-04-02 LAB — PREGNANCY, URINE: Preg Test, Ur: NEGATIVE

## 2021-04-02 MED ORDER — CEPHALEXIN 500 MG PO CAPS
500.0000 mg | ORAL_CAPSULE | Freq: Four times a day (QID) | ORAL | 0 refills | Status: AC
  Filled 2021-04-02: qty 28, 7d supply, fill #0

## 2021-04-02 MED ORDER — ONDANSETRON HCL 4 MG/2ML IJ SOLN
4.0000 mg | Freq: Once | INTRAMUSCULAR | Status: AC
  Administered 2021-04-02: 4 mg via INTRAVENOUS
  Filled 2021-04-02: qty 2

## 2021-04-02 MED ORDER — SODIUM CHLORIDE 0.9 % IV SOLN
12.5000 mg | Freq: Four times a day (QID) | INTRAVENOUS | Status: DC | PRN
  Administered 2021-04-02: 12.5 mg via INTRAVENOUS
  Filled 2021-04-02: qty 0.5

## 2021-04-02 MED ORDER — CAPSAICIN 0.025 % EX CREA
TOPICAL_CREAM | Freq: Once | CUTANEOUS | Status: AC
  Administered 2021-04-02: 1 via TOPICAL
  Filled 2021-04-02: qty 60

## 2021-04-02 MED ORDER — PROMETHAZINE HCL 25 MG/ML IJ SOLN
INTRAMUSCULAR | Status: AC
  Filled 2021-04-02: qty 1

## 2021-04-02 MED ORDER — ONDANSETRON 4 MG PO TBDP
4.0000 mg | ORAL_TABLET | Freq: Three times a day (TID) | ORAL | 0 refills | Status: AC | PRN
  Filled 2021-04-02: qty 20, 7d supply, fill #0

## 2021-04-02 MED ORDER — SODIUM CHLORIDE 0.9 % IV BOLUS
1000.0000 mL | Freq: Once | INTRAVENOUS | Status: AC
  Administered 2021-04-02: 1000 mL via INTRAVENOUS

## 2021-04-02 MED ORDER — KETOROLAC TROMETHAMINE 30 MG/ML IJ SOLN
30.0000 mg | Freq: Once | INTRAMUSCULAR | Status: AC
  Administered 2021-04-02: 30 mg via INTRAVENOUS
  Filled 2021-04-02: qty 1

## 2021-04-02 NOTE — ED Notes (Signed)
Pt reports relief in pain and nausea. Tolerating PO.

## 2021-04-02 NOTE — ED Triage Notes (Signed)
Pt c/o n/v started this am-states she was seen for same 2 days ago-NAD-steady gait

## 2021-04-02 NOTE — ED Notes (Signed)
Pt in restroom in waiting room. 

## 2021-04-02 NOTE — ED Notes (Signed)
Pt aware of the need for more urine sample. Cup given but unable to void at the moment.

## 2021-04-02 NOTE — ED Provider Notes (Signed)
At time of plan to discharge by Dr. Delford Field, the patient had recurrence of nausea and vomiting and complained of abdominal pain.  I have reassessed the patient.  She is nontoxic.  She does have marijuana use and prior episodes of vomiting. Physical Exam  BP 127/72 (BP Location: Right Arm)   Pulse 60   Temp 98.4 F (36.9 C) (Oral)   Resp 16   Ht 5\' 4"  (1.626 m)   Wt 46.7 kg   LMP 03/29/2021   SpO2 100%   BMI 17.68 kg/m   Physical Exam  ED Course/Procedures     Procedures  MDM  Patient is alert and nontoxic.  She did feel Capzasin was helpful.  Patient was given a dose of Phenergan 12.5 mg and Toradol.  On reassessment she is now calm without significant pain or ongoing dry heaves or vomiting.  Anticipate patient will be stable for discharge.  We will try p.o. challenge.  Review of urinalysis shows nitrite but microscopic does not show white cells or blood.  Lower suspicion for severe UTI/pyelonephritis.       05/29/2021, MD 04/02/21 [phone]

## 2021-04-02 NOTE — ED Notes (Signed)
Pt given room temp gingerale to sip and crackers, she says she feels better but is afraid of vomiting again.

## 2021-04-02 NOTE — ED Provider Notes (Signed)
[facility] EMERGENCY DEPARTMENT Provider Note   CSN: [id number] Arrival date & time: 04/02/21  1203     History Chief Complaint  Patient presents with   Vomiting    Julia Goodwin is a 25 y.o. female.  Seen here a few days ago.  Labs were within normal limits, and she was symptomatically improved after hydration.  The history is provided by the patient.  Emesis Severity:  Moderate Duration:  2 days Timing:  Intermittent Quality:  Stomach contents Progression:  Unchanged Chronicity:  New Urine output: frequency. Relieved by:  Nothing Worsened by:  Nothing Ineffective treatments:  Liquids Associated symptoms: abdominal pain (central) and diarrhea   Associated symptoms: no arthralgias, no chills, no cough, no fever and no sore throat       Past Medical History:  Diagnosis Date   Headache    Heart murmur    Marihuana abuse     There are no problems to display for this patient.   History reviewed. No pertinent surgical history.   OB History     Gravida  1   Para      Term      Preterm      AB      Living         SAB      IAB      Ectopic      Multiple      Live Births              Family History  Problem Relation Age of Onset   Hypertension Other     Social History   Tobacco Use   Smoking status: Former    Types: Cigarettes   Smokeless tobacco: Never  Vaping Use   Vaping Use: Never used  Substance Use Topics   Alcohol use: Yes    Comment: occ   Drug use: Yes    Types: Marijuana    Comment: last smoked 04/01/21    Home Medications Prior to Admission medications   Medication Sig Start Date End Date Taking? Authorizing Provider  dicyclomine (BENTYL) 20 MG tablet Take 1 tablet (20 mg total) by mouth 2 (two) times daily. 03/03/19   Renne Crigler, PA-C  metoCLOPramide (REGLAN) 10 MG tablet Take 1 tablet (10 mg total) by mouth 3 (three) times daily with meals. 03/31/21 03/31/22  Elson Areas, PA-C   metroNIDAZOLE (FLAGYL) 500 MG tablet Take 1 tablet (500 mg total) by mouth 2 (two) times daily. 03/05/21   Gwyneth Sprout, MD  ondansetron (ZOFRAN) 4 MG tablet Take 1 tablet (4 mg total) by mouth every 8 (eight) hours as needed for nausea or vomiting. 03/02/19   Mesner, Barbara Cower, MD  potassium chloride SA (K-DUR) 20 MEQ tablet Take 2 tablets (40 mEq total) by mouth 2 (two) times daily for 7 days. 03/02/19 03/09/19  Mesner, Barbara Cower, MD    Allergies    Patient has no known allergies.  Review of Systems   Review of Systems  Constitutional:  Negative for chills and fever.  HENT:  Negative for ear pain and sore throat.   Eyes:  Negative for pain and visual disturbance.  Respiratory:  Negative for cough and shortness of breath.   Cardiovascular:  Negative for chest pain and palpitations.  Gastrointestinal:  Positive for abdominal pain (central), diarrhea and vomiting.  Genitourinary:  Positive for dysuria and frequency. Negative for hematuria.  Musculoskeletal:  Negative for arthralgias and back pain.  Skin:  Negative for color  change and rash.  Neurological:  Negative for seizures and syncope.  All other systems reviewed and are negative.  Physical Exam Updated Vital Signs BP (!) 144/98 (BP Location: Left Arm)   Pulse 72   Temp 98.4 F (36.9 C) (Oral)   Resp 18   Ht 5\' 4"  (1.626 m)   Wt 46.7 kg   LMP 03/29/2021   SpO2 99%   BMI 17.68 kg/m   Physical Exam Vitals and nursing note reviewed.  HENT:     Head: Normocephalic and atraumatic.  Eyes:     General: No scleral icterus. Pulmonary:     Effort: Pulmonary effort is normal. No respiratory distress.  Abdominal:     General: There is no distension.     Palpations: Abdomen is soft.     Tenderness: There is no abdominal tenderness. There is no guarding.  Musculoskeletal:     Cervical back: Normal range of motion.  Skin:    General: Skin is warm and dry.  Neurological:     General: No focal deficit present.     Mental Status:  She is alert and oriented to person, place, and time.  Psychiatric:        Mood and Affect: Mood normal.    ED Results / Procedures / Treatments   Labs (all labs ordered are listed, but only abnormal results are displayed) Labs Reviewed  COMPREHENSIVE METABOLIC PANEL - Abnormal; Notable for the following components:      Result Value   Potassium 3.4 (*)    Glucose, Bld 128 (*)    All other components within normal limits  CBC - Abnormal; Notable for the following components:   RBC 3.85 (*)    All other components within normal limits  URINALYSIS, ROUTINE W REFLEX MICROSCOPIC - Abnormal; Notable for the following components:   Specific Gravity, Urine >1.030 (*)    Hgb urine dipstick SMALL (*)    Bilirubin Urine MODERATE (*)    Ketones, ur >80 (*)    Protein, ur 30 (*)    Nitrite POSITIVE (*)    All other components within normal limits  URINALYSIS, MICROSCOPIC (REFLEX) - Abnormal; Notable for the following components:   Bacteria, UA FEW (*)    All other components within normal limits  LIPASE, BLOOD  PREGNANCY, URINE    EKG None  Radiology No results found.  Procedures Procedures   Medications Ordered in ED Medications  ondansetron (ZOFRAN) injection 4 mg (4 mg Intravenous Given 04/02/21 1414)  sodium chloride 0.9 % bolus 1,000 mL (0 mLs Intravenous Stopped 04/02/21 1530)    ED Course  I have reviewed the triage vital signs and the nursing notes.  Pertinent labs & imaging results that were available during my care of the patient were reviewed by me and considered in my medical decision making (see chart for details).    MDM Rules/Calculators/A&P                           06/02/21 presented with nausea, vomiting, and diarrhea.  She was found to have ketones in her urine and was given IV hydration.  She was also found to have a urinary tract infection will be treated with Keflex.  Culture is pending.  Abdominal exam was benign and not suggestive of  serious intra-abdominal pathology, and imaging was deferred.  I do not think pyelonephritis is a concern.  I have considered biliary pathology, intestinal obstruction, intestinal perforation,  and appendicitis. Final Clinical Impression(s) / ED Diagnoses Final diagnoses:  Acute cystitis with hematuria  Gastroenteritis    Rx / DC Orders ED Discharge Orders          Ordered    cephALEXin (KEFLEX) 500 MG capsule  4 times daily        04/02/21 1535    ondansetron (ZOFRAN-ODT) 4 MG disintegrating tablet  Every 8 hours PRN        04/02/21 1535             Koleen Distance, MD 04/02/21 1537

## 2021-04-04 LAB — URINE CULTURE: Culture: <10000 — AB
